# Patient Record
Sex: Female | Born: 1935 | Race: White | Hispanic: No | Marital: Married | Smoking: Never smoker
Health system: Southern US, Community
[De-identification: ages and names within clinical notes are randomized; demographics above are authoritative.]

## PROBLEM LIST (undated history)

## (undated) DIAGNOSIS — Z923 Personal history of irradiation: Secondary | ICD-10-CM

## (undated) DIAGNOSIS — K7689 Other specified diseases of liver: Secondary | ICD-10-CM

## (undated) DIAGNOSIS — M1990 Unspecified osteoarthritis, unspecified site: Secondary | ICD-10-CM

## (undated) DIAGNOSIS — N189 Chronic kidney disease, unspecified: Secondary | ICD-10-CM

## (undated) DIAGNOSIS — F419 Anxiety disorder, unspecified: Secondary | ICD-10-CM

## (undated) DIAGNOSIS — I10 Essential (primary) hypertension: Secondary | ICD-10-CM

## (undated) DIAGNOSIS — C541 Malignant neoplasm of endometrium: Secondary | ICD-10-CM

## (undated) DIAGNOSIS — Z9889 Other specified postprocedural states: Secondary | ICD-10-CM

## (undated) DIAGNOSIS — Z8742 Personal history of other diseases of the female genital tract: Secondary | ICD-10-CM

## (undated) DIAGNOSIS — E7800 Pure hypercholesterolemia, unspecified: Secondary | ICD-10-CM

## (undated) DIAGNOSIS — N939 Abnormal uterine and vaginal bleeding, unspecified: Secondary | ICD-10-CM

## (undated) HISTORY — DX: Essential (primary) hypertension: I10

## (undated) HISTORY — DX: Abnormal uterine and vaginal bleeding, unspecified: N93.9

## (undated) HISTORY — DX: Pure hypercholesterolemia, unspecified: E78.00

## (undated) HISTORY — DX: Personal history of irradiation: Z92.3

## (undated) HISTORY — PX: ABDOMINAL HYSTERECTOMY: SHX81

## (undated) HISTORY — DX: Chronic kidney disease, unspecified: N18.9

## (undated) HISTORY — DX: Other specified diseases of liver: K76.89

## (undated) HISTORY — DX: Anxiety disorder, unspecified: F41.9

## (undated) HISTORY — DX: Other specified postprocedural states: Z98.890

## (undated) HISTORY — DX: Personal history of other diseases of the female genital tract: Z87.42

## (undated) HISTORY — PX: INFECTED SKIN DEBRIDEMENT: SHX678

## (undated) HISTORY — DX: Malignant neoplasm of endometrium: C54.1

## (undated) HISTORY — DX: Unspecified osteoarthritis, unspecified site: M19.90

## (undated) HISTORY — PX: BREAST REDUCTION SURGERY: SHX8

## (undated) HISTORY — PX: OTHER SURGICAL HISTORY: SHX169

---

## 1997-08-17 ENCOUNTER — Emergency Department (HOSPITAL_COMMUNITY): Admission: EM | Admit: 1997-08-17 | Discharge: 1997-08-17 | Payer: Self-pay | Admitting: Emergency Medicine

## 1997-12-30 ENCOUNTER — Other Ambulatory Visit: Admission: RE | Admit: 1997-12-30 | Discharge: 1997-12-30 | Payer: Self-pay | Admitting: Internal Medicine

## 1999-01-25 ENCOUNTER — Other Ambulatory Visit: Admission: RE | Admit: 1999-01-25 | Discharge: 1999-01-25 | Payer: Self-pay | Admitting: Internal Medicine

## 1999-05-16 ENCOUNTER — Encounter: Admission: RE | Admit: 1999-05-16 | Discharge: 1999-05-16 | Payer: Self-pay | Admitting: Internal Medicine

## 1999-05-16 ENCOUNTER — Encounter: Payer: Self-pay | Admitting: Internal Medicine

## 1999-07-18 ENCOUNTER — Encounter: Admission: RE | Admit: 1999-07-18 | Discharge: 1999-07-18 | Payer: Self-pay | Admitting: Plastic Surgery

## 1999-07-18 ENCOUNTER — Encounter: Payer: Self-pay | Admitting: Plastic Surgery

## 1999-07-19 ENCOUNTER — Encounter (INDEPENDENT_AMBULATORY_CARE_PROVIDER_SITE_OTHER): Payer: Self-pay | Admitting: *Deleted

## 1999-07-19 ENCOUNTER — Ambulatory Visit (HOSPITAL_BASED_OUTPATIENT_CLINIC_OR_DEPARTMENT_OTHER): Admission: RE | Admit: 1999-07-19 | Discharge: 1999-07-20 | Payer: Self-pay | Admitting: Plastic Surgery

## 2000-06-11 ENCOUNTER — Encounter: Payer: Self-pay | Admitting: Orthopedic Surgery

## 2000-06-11 ENCOUNTER — Encounter: Admission: RE | Admit: 2000-06-11 | Discharge: 2000-06-11 | Payer: Self-pay | Admitting: Orthopedic Surgery

## 2000-06-12 ENCOUNTER — Ambulatory Visit (HOSPITAL_BASED_OUTPATIENT_CLINIC_OR_DEPARTMENT_OTHER): Admission: RE | Admit: 2000-06-12 | Discharge: 2000-06-12 | Payer: Self-pay | Admitting: *Deleted

## 2000-08-29 ENCOUNTER — Encounter: Admission: RE | Admit: 2000-08-29 | Discharge: 2000-08-29 | Payer: Self-pay | Admitting: General Surgery

## 2000-08-29 ENCOUNTER — Encounter: Payer: Self-pay | Admitting: General Surgery

## 2000-10-03 ENCOUNTER — Ambulatory Visit (HOSPITAL_COMMUNITY): Admission: RE | Admit: 2000-10-03 | Discharge: 2000-10-03 | Payer: Self-pay | Admitting: Gastroenterology

## 2001-03-06 ENCOUNTER — Encounter: Payer: Self-pay | Admitting: Internal Medicine

## 2001-03-06 ENCOUNTER — Encounter: Admission: RE | Admit: 2001-03-06 | Discharge: 2001-03-06 | Payer: Self-pay | Admitting: Internal Medicine

## 2001-06-14 ENCOUNTER — Encounter: Payer: Self-pay | Admitting: Internal Medicine

## 2001-06-14 ENCOUNTER — Encounter: Admission: RE | Admit: 2001-06-14 | Discharge: 2001-06-14 | Payer: Self-pay | Admitting: Internal Medicine

## 2002-02-06 ENCOUNTER — Encounter: Admission: RE | Admit: 2002-02-06 | Discharge: 2002-02-06 | Payer: Self-pay | Admitting: Internal Medicine

## 2002-02-06 ENCOUNTER — Encounter: Payer: Self-pay | Admitting: Internal Medicine

## 2002-04-16 ENCOUNTER — Encounter: Admission: RE | Admit: 2002-04-16 | Discharge: 2002-04-16 | Payer: Self-pay | Admitting: Internal Medicine

## 2002-04-16 ENCOUNTER — Encounter: Payer: Self-pay | Admitting: Internal Medicine

## 2003-02-10 ENCOUNTER — Encounter: Admission: RE | Admit: 2003-02-10 | Discharge: 2003-02-10 | Payer: Self-pay | Admitting: Internal Medicine

## 2004-03-28 ENCOUNTER — Encounter: Admission: RE | Admit: 2004-03-28 | Discharge: 2004-03-28 | Payer: Self-pay | Admitting: Internal Medicine

## 2004-04-06 ENCOUNTER — Encounter: Admission: RE | Admit: 2004-04-06 | Discharge: 2004-04-06 | Payer: Self-pay | Admitting: Internal Medicine

## 2005-01-26 ENCOUNTER — Ambulatory Visit (HOSPITAL_COMMUNITY): Admission: RE | Admit: 2005-01-26 | Discharge: 2005-01-26 | Payer: Self-pay | Admitting: General Surgery

## 2005-03-22 ENCOUNTER — Ambulatory Visit (HOSPITAL_COMMUNITY): Admission: RE | Admit: 2005-03-22 | Discharge: 2005-03-22 | Payer: Self-pay | Admitting: General Surgery

## 2005-03-25 ENCOUNTER — Emergency Department (HOSPITAL_COMMUNITY): Admission: EM | Admit: 2005-03-25 | Discharge: 2005-03-26 | Payer: Self-pay | Admitting: Emergency Medicine

## 2005-04-06 ENCOUNTER — Ambulatory Visit (HOSPITAL_COMMUNITY): Admission: RE | Admit: 2005-04-06 | Discharge: 2005-04-06 | Payer: Self-pay | Admitting: Internal Medicine

## 2005-05-12 ENCOUNTER — Encounter: Admission: RE | Admit: 2005-05-12 | Discharge: 2005-05-12 | Payer: Self-pay | Admitting: Internal Medicine

## 2005-07-12 ENCOUNTER — Encounter (INDEPENDENT_AMBULATORY_CARE_PROVIDER_SITE_OTHER): Payer: Self-pay | Admitting: *Deleted

## 2005-07-12 ENCOUNTER — Ambulatory Visit (HOSPITAL_COMMUNITY): Admission: RE | Admit: 2005-07-12 | Discharge: 2005-07-13 | Payer: Self-pay | Admitting: General Surgery

## 2005-09-25 ENCOUNTER — Other Ambulatory Visit: Admission: RE | Admit: 2005-09-25 | Discharge: 2005-09-25 | Payer: Self-pay | Admitting: Obstetrics and Gynecology

## 2005-10-25 ENCOUNTER — Encounter: Admission: RE | Admit: 2005-10-25 | Discharge: 2005-10-25 | Payer: Self-pay | Admitting: Internal Medicine

## 2005-11-13 ENCOUNTER — Encounter (INDEPENDENT_AMBULATORY_CARE_PROVIDER_SITE_OTHER): Payer: Self-pay | Admitting: Specialist

## 2005-11-13 ENCOUNTER — Ambulatory Visit (HOSPITAL_COMMUNITY): Admission: RE | Admit: 2005-11-13 | Discharge: 2005-11-13 | Payer: Self-pay | Admitting: Obstetrics and Gynecology

## 2006-05-14 ENCOUNTER — Encounter: Admission: RE | Admit: 2006-05-14 | Discharge: 2006-05-14 | Payer: Self-pay | Admitting: Internal Medicine

## 2007-05-18 ENCOUNTER — Ambulatory Visit: Payer: Self-pay | Admitting: Emergency Medicine

## 2007-05-18 ENCOUNTER — Inpatient Hospital Stay (HOSPITAL_COMMUNITY): Admission: EM | Admit: 2007-05-18 | Discharge: 2007-05-28 | Payer: Self-pay | Admitting: Emergency Medicine

## 2007-05-18 ENCOUNTER — Encounter (INDEPENDENT_AMBULATORY_CARE_PROVIDER_SITE_OTHER): Payer: Self-pay | Admitting: Orthopedic Surgery

## 2007-05-19 ENCOUNTER — Ambulatory Visit: Payer: Self-pay | Admitting: Infectious Diseases

## 2007-06-04 ENCOUNTER — Ambulatory Visit: Payer: Self-pay | Admitting: Internal Medicine

## 2007-06-04 DIAGNOSIS — M009 Pyogenic arthritis, unspecified: Secondary | ICD-10-CM | POA: Insufficient documentation

## 2007-06-24 ENCOUNTER — Telehealth (INDEPENDENT_AMBULATORY_CARE_PROVIDER_SITE_OTHER): Payer: Self-pay | Admitting: *Deleted

## 2007-06-25 ENCOUNTER — Emergency Department (HOSPITAL_COMMUNITY): Admission: EM | Admit: 2007-06-25 | Discharge: 2007-06-26 | Payer: Self-pay | Admitting: Emergency Medicine

## 2007-07-01 ENCOUNTER — Telehealth: Payer: Self-pay | Admitting: Internal Medicine

## 2007-07-02 ENCOUNTER — Ambulatory Visit: Payer: Self-pay | Admitting: Internal Medicine

## 2007-07-02 DIAGNOSIS — R197 Diarrhea, unspecified: Secondary | ICD-10-CM | POA: Insufficient documentation

## 2007-10-10 ENCOUNTER — Encounter: Admission: RE | Admit: 2007-10-10 | Discharge: 2007-10-10 | Payer: Self-pay | Admitting: Internal Medicine

## 2008-10-12 ENCOUNTER — Encounter: Admission: RE | Admit: 2008-10-12 | Discharge: 2008-10-12 | Payer: Self-pay | Admitting: Internal Medicine

## 2009-10-13 ENCOUNTER — Encounter: Admission: RE | Admit: 2009-10-13 | Discharge: 2009-10-13 | Payer: Self-pay | Admitting: Internal Medicine

## 2009-11-30 ENCOUNTER — Encounter: Admission: RE | Admit: 2009-11-30 | Discharge: 2009-11-30 | Payer: Self-pay | Admitting: Neurology

## 2010-05-07 ENCOUNTER — Encounter: Payer: Self-pay | Admitting: Internal Medicine

## 2010-05-08 ENCOUNTER — Encounter: Payer: Self-pay | Admitting: Internal Medicine

## 2010-06-08 ENCOUNTER — Ambulatory Visit: Payer: Medicare Other | Attending: Radiation Oncology | Admitting: Radiation Oncology

## 2010-06-08 DIAGNOSIS — M899 Disorder of bone, unspecified: Secondary | ICD-10-CM | POA: Insufficient documentation

## 2010-06-08 DIAGNOSIS — Z79899 Other long term (current) drug therapy: Secondary | ICD-10-CM | POA: Insufficient documentation

## 2010-06-08 DIAGNOSIS — Z510 Encounter for antineoplastic radiation therapy: Secondary | ICD-10-CM | POA: Insufficient documentation

## 2010-06-08 DIAGNOSIS — C55 Malignant neoplasm of uterus, part unspecified: Secondary | ICD-10-CM | POA: Insufficient documentation

## 2010-06-08 DIAGNOSIS — I10 Essential (primary) hypertension: Secondary | ICD-10-CM | POA: Insufficient documentation

## 2010-08-22 ENCOUNTER — Ambulatory Visit: Payer: Medicare Other | Attending: Radiation Oncology | Admitting: Radiation Oncology

## 2010-08-30 NOTE — H&P (Signed)
[name], [name] NO.:  [id number]   MEDICAL RECORD NO.:  [id number]          PATIENT TYPE:  INP   LOCATION:  0098                         FACILITY:  [facility]   PHYSICIAN:  Alvy Beal, MD    DATE OF BIRTH:  [birth year]   DATE OF ADMISSION:  05/18/2007  DATE OF DISCHARGE:                              HISTORY & PHYSICAL   HISTORY:  She is a 77 year old woman who presents with a 1 day history  of severe swelling of the right knee and pain.  The patient indicates  that in 2002 she had a ligament reconstruction and did okay and then  proceeded to have significant increasing knee pain.  She was then seen  by my partner, Dr. Simonne Come, who referred her to Dr. Lequita Halt.  Dr.  Lequita Halt felt as though a total knee was required because of severe  degenerative disease.  The patient was treated with physical therapy,  improved and was doing well until recently.  She notes no history of  pain or injury or trauma to the knee.  She states last night it just  began swelling and felt warm.  She had a recent bout of bronchitis for  which she was being treated.  She states now that the pain is the worst  it has ever been, it is a 10/10.  As a result of the swelling the knee  was aspirated by the ER physician and was felt to be a septic joint and  so orthopedic consultation was requested.   PAST MEDICAL, SURGICAL, FAMILY AND SOCIAL HISTORY:  Includes asthma,  hypertension, hypercholesterolemia.  She has had breast reduction  surgery, knee surgery and thyroid surgery.  She is a nonsmoker,  nondrinker, nondrug abuser.  She is allergic to DICLOFENAC.  She is  currently taking Crestor, prednisone, Benicar.  Other than the recent  bronchitis she is otherwise healthy.  She has no other pertinent  positives on her review of systems other than the degenerative joint  disease and the recent bronchitis.   CLINICAL EXAM:  She is in bed in obvious distress, complaining of severe  knee pain.   The right knee is warm and swollen.  There is an effusion  noted.  Her distal neurological exam is intact.  She has tibialis  anterior gastrocnemius.  EHL function is intact.  She has severe pain  with attempts at range of motion of the knee.  Vascular exam 2+  posterior tibial and dorsalis pedis pulses.  The left lower extremity  and both upper extremities are unremarkable on range of motion testing  palpation.   LAB:  Her urinalysis was negative.  CBC, her white count was 12.8 and  her hemoglobin is 11.6 and her platelet count is 253.  The Gram stain  from the knee aspirate demonstrated a cloudy yellowish fluid.  There is  84,700 white blood cells, no crystals and gram positive rods.   At this point in time I have discussed the case with Dr. Rennis Chris.  He  will plan on taking the patient later today for definitive I and D.  We  will consent the patient for an open versus arthroscopic I and D.   ADDENDUM:  X-rays were also reviewed from today.  They demonstrate  marked degenerative disease with significant medial compartment  arthritis.  There is a slight effusion but no evidence of any  osteomyelitis or lytic bony destruction.  At this point in time we will  keep the patient n.p.o. and plan on I and D.  Since we do already have  an aspirate we can get cultures and sensitivities and will plan on  starting her on IV antibiotics.      Alvy Beal, MD  Electronically Signed     DDB/MEDQ  D:  05/18/2007  T:  05/18/2007  Job:  [id number]

## 2010-08-30 NOTE — Op Note (Signed)
[name], WAHEED              ACCOUNT NO.:  [id number]   MEDICAL RECORD NO.:  [id number]          PATIENT TYPE:  INP   LOCATION:  1229                         [facility]AC[facility]   PHYSICIAN:  Vania Rea. Supple, M.D.  DATE OF BIRTH:  1928-04-03   DATE OF PROCEDURE:  05/20/2007  DATE OF DISCHARGE:                               OPERATIVE REPORT   PREOPERATIVE DIAGNOSES:  1. Pyogenic myositis.  2. Septic right knee.   POSTOPERATIVE DIAGNOSES:  1. Pyogenic myositis.  2. Septic right knee.   PROCEDURE:  1. Exploration, irrigation and debridement of right thigh and knee      joint.  2. Application of a vacuum assisted closure system to the right thigh      wound.   SURGEON OF RECORD:  Vania Rea. Supple, M.D.   Threasa HeadsFrench Ana A. Shuford, P.A.-C.   ANESTHESIA:  General endotracheal anesthetic.   BLOOD LOSS:  Minimal.   HISTORY:  [name] is a 77 year old female who was admitted over the  weekend with a septic right knee and evidence for myositis of the right  thigh anterior compartment musculature which was initially thought to  represent a clostridial infection, but on initial culture result it  appears as though she is growing out Group A beta hemolytic strep.  She  has been brought back for serial debridements of her right thigh wound  and is returned to the operating this evening for repeat I&D with  possible VAC applications.   Preoperatively discussed with Ms. Siebels, family members, treatment  options as well as risks versus benefits thereof.  Possible  complications of bleeding, persistent infection and likely need for  additional surgery reviewed.  They understand and accept and agree with  planned procedure.   PROCEDURE IN DETAIL:  After undergoing routine preop evaluation, the  patient was brought directly from the Intensive Care Unit intubated to  the operating room and transferred to the operating table in the supine  position.  She underwent application  of general anesthesia.  She was  placed into a semilateral position with the right hip elevated on the  bolster.  The right lower extremity was then sterilely prepped and  draped in standard fashion.  The wound was explored carefully and with  the vastus lateralis being elevated anteriorly and this allowed  inspection of the deep fascial layers and down to the femoral shaft.  We  carefully inspected all dissected surfaces and saw no obvious purulence.  All muscle had improved color and turgor compared to the initial  debridement.  Distally there was evidence for clear synovial fluid.  Did  not see any obvious purulence.  All muscle appeared viable.  No  significant muscular removal was necessary.  We used pulsatile lavage to  meticulously clean all surfaces and carefully inspected and deeply  palpated and compressed all the aspects of the thigh compartment  musculature.  There was no evidence for fluid accumulations or  abscesses.  Once we had meticulously irrigated and debrided all exposed  surfaces, we then allowed the vastus lateralis to fall into its anatomic  position.  A VAC system was then trimmed to fit over the lateral wound  which is approximately 40 cm in length.  We then fastened the VAC  sponges in place with the impervious drapes and then applied suction  with good sealing around the wound.  The right lower extremity was then  wrapped from foot to knee with an Ace bandage.  At this point then the  patient was transferred back to her hospital bed and returned to the  Intensive Care Unit intubated in stable condition.      Vania Rea. Supple, M.D.  Electronically Signed     KMS/MEDQ  D:  05/20/2007  T:  05/21/2007  Job:  [id number]

## 2010-08-30 NOTE — Op Note (Signed)
NAMEJAZLENE, [name]              ACCOUNT NO.:  [id number]   MEDICAL RECORD NO.:  [id number]          PATIENT TYPE:  INP   LOCATION:  1229                         FACILITY:  [facility]   PHYSICIAN:  Vania Rea. Supple, M.D.  DATE OF BIRTH:  10-25-1927   DATE OF PROCEDURE:  05/23/2007  DATE OF DISCHARGE:                               OPERATIVE REPORT   PREOPERATIVE DIAGNOSIS:  Right thigh pyomyositis status post  exploration, irrigation and debridement x3.   POSTOPERATIVE DIAGNOSIS:  Right thigh pyomyositis status post  exploration, irrigation and debridement x3.   PROCEDURE:  1. Irrigation and debridement of right thigh wound.  2. Delayed primary closure of right thigh wound approximately 40 cm in      length.   SURGEON:  Vania Rea. Supple, M.D.   Threasa HeadsFrench Ana A. Shuford, P.A.-C.   ANESTHESIA:  GENERAL ENDOTRACHEAL ANESTHESIA.   ESTIMATED BLOOD LOSS:  Minimal.   DRAINS:  Hemovacs x2.   HISTORY:  [name] is a 77 year old female who has been previously  treated for a right thigh anterior compartment myositis secondary to  group A beta strep with an associated septic right knee.  She has been  brought to the operating room for multiple debridements and at this  point, clinically is doing very well with a normalizing white count,  remains afebrile with improved pulmonary function.  She has had a vacuum  assisted closure device of the right thigh which has had some limited  serous drainage and at this time she is brought back to the operating  room for planned repeat I&D as well as probable delayed primary closure  of the right thigh wound.   We have counseled [name] as well as her family members on the  treatment options as well as risks and benefits thereof.  The possible  complications of bleeding, persistent infection, and possible need for  additional surgery are reviewed.  She understands and accepts and agrees  with the plan.   PROCEDURE IN DETAIL:  After  undergoing routine preop evaluation, the  patient is brought to the recovery room, underwent the smooth induction  of general endotracheal anesthesia on her hospital bed and then was  transferred supine to the operating table.  She was appropriately padded  and protected.  The right lower extremity was sterilely prepped and  draped in a standard fashion.  The wound edges were all viable.  The  underlying muscle was pink and had good tissue quality with bleeding  edges.  Deep dissection allowed inspection of the anterior margin of the  femoral shaft and the surrounding tissues all appeared viable.  There  was no obvious purulence.  There was no foul smell.  The pulsatile  lavage was then used to meticulously debride all dissected surfaces.  There was no obvious muscular necrosis.  At the completion of the  irrigation, the wounds were all then dried.  We performed a closure  distally of the deep fascia utilizing figure-of-eight #1 Vicryl sutures.  The remaining incision was closed with #1 nylon in vertical mattress  fashion which allowed excellent apposition of  the skin and subcutaneous  layers.  The deep fascia was left open.  We did place two Hemovac  drains, one was placed to medially in the deep aspect of the wound and  exited distally in the suprapatellar region and a second drain was  brought out proximally posteriorly and drained the posterior aspect of  the wound.  The wound was closed in the above mentioned fashion.  Adaptic and dry dressing was applied.  The leg was then dressed with a  bulky dry dressing wrapped with an Ace bandage.  The patient was then  extubated and taken to the recovery room stable condition.      Vania Rea. Supple, M.D.  Electronically Signed     KMS/MEDQ  D:  05/23/2007  T:  05/24/2007  Job:  [id number]

## 2010-08-30 NOTE — Op Note (Signed)
[name], Pamela NO.:  [id number]   MEDICAL RECORD NO.:  [id number]          PATIENT TYPE:  INP   LOCATION:  0098                         FACILITY:  [facility]   PHYSICIAN:  Vania Rea. Supple, M.D.  DATE OF BIRTH:  [birth year]   DATE OF PROCEDURE:  05/18/2007  DATE OF DISCHARGE:                               OPERATIVE REPORT   PREOPERATIVE DIAGNOSIS:  Acutely septic right knee.   POSTOPERATIVE DIAGNOSIS:  Acutely septic right knee.   PROCEDURE:  1. Right knee arthroscopic lavage.  2. Right knee arthroscopic extensive synovectomy.  3. Partial medial meniscectomy.  4. Chondroplasty medial compartment.   SURGEON:  Vania Rea. Supple, M.D.   ANESTHESIA:  LMA general.   TOURNIQUET TIME:  None was used.   ESTIMATED BLOOD LOSS:  Minimal.   DRAINS:  Hemovac x1.   SPECIMENS:  None.   HISTORY:  Pamela Pamela is a  77 year old female who presents with 24  hours of severe right knee pain with no antecedent trauma.  She was  recently diagnosed with a bronchitis.  She has been placed on a short  course of prednisone by primary physician.  She has had previous right  knee arthroscopy a number of years ago and had been having some  difficulties with the right knee pain several years ago and was told  that she needed additional surgery with Dr. Lequita Halt.  However, she began  exercise routine and had been doing relatively well with her right knee  until this most recent acute episode of pain.  On presentation to the  emergency room early this morning, she was found to be an extreme  discomfort with a painful swollen right knee.  A right knee aspiration  was performed by the emergency room staff revealing cloudy fluid with a  white cell count elevated at 84,000.  She is subsequently brought to the  operating at this time for planned right knee arthroscopy, debridement  and lavage.   Preoperatively counseled Pamela Pamela and her husband on treatment  options as well as risks  versus benefits thereof.  Possible surgical  complications bleeding, infection, neurovascular injury, DVT, PE, as  well as persistent pain, persistent infection and possible need for  additional surgery reviewed.  She understands and accepts and agrees for  planned procedure.   PROCEDURE IN DETAIL:  After undergoing routine preop evaluation, the  patient brought to the operating room, placed upon the operating table  and underwent smooth induction of LMA general anesthesia.  Right thigh  was placed in leg holder.  Right lower extremity was sterilely prepped  and draped in standard fashion.  Standard arthroscopy portals were  established and diagnostic arthroscopies performed.  The  suprapatellar  pouch showed diffuse synovitis and there were a number of bands of  fibrinous exudate and significant proliferative synovial overgrowth.  A  shaver was introduced and used to perform an extensive synovectomy in  the suprapatellar pouch as well as medial and lateral gutters.  The  patellofemoral joint showed some mild fibrillation of the tibia surfaces  but no chondral fractures or full thickness defects.  There was normal  patellar tracking.  There was significant proliferative overgrowth of  the anterior chamber synovial tissues and extensive synovectomy was  performed in this area.  The trial notch showed the ACL to be intact.  Medially there was evidence for some early arthrosis with an area of  full-thickness cartilage loss over the most medial aspect of the medial  tibial plateau and surrounding area grade 3 chondromalacia.  In  addition, there was degenerative tear involving the middle third of the  medial meniscus.  A shaver was introduced and used to debride the  meniscus back to stable peripheral margin and perform a chondroplasty,  both medial tibial plateau the medial femoral condyle.  Synovitis in the  inner cartilage notch as well as medial gutter was also debrided.  Laterally there  was synovial overgrowth anteriorly which was debrided.  Lateral compartment itself is in relatively good condition. Meniscus was  intact.  Additional time was spent completing the synovectomy and  debriding all aspects of the joint removing all synovial tissues that  appeared inflamed and performing and  completing an extensive  synovectomy.  The joint was then copiously lavaged.  At this point,  fluid and instruments were then removed.  A Hemovac drain was brought in  superomedially  through the inflow cannula portal.  The portals then  closed with a 3-0 nylon.  The drain was not sutured in.  A bulky dry  dressing was then wrapped about the right knee and leg wrapped in Ace  bandage.  The patient was then transferred to the recovery room in  stable condition.      Vania Rea. Supple, M.D.  Electronically Signed     KMS/MEDQ  D:  05/18/2007  T:  05/19/2007  Job:  [id number]

## 2010-08-30 NOTE — Op Note (Signed)
NAMESHARNETTA, GIELOW              ACCOUNT NO.:  [id number]   MEDICAL RECORD NO.:  [id number]          PATIENT TYPE:  INP   LOCATION:  1229                         [facility]   PHYSICIAN:  Vania Rea. Supple, M.D.  DATE OF BIRTH:  Oct 21, 1927   DATE OF PROCEDURE:  05/19/2007  DATE OF DISCHARGE:                               OPERATIVE REPORT   PREOPERATIVE DIAGNOSIS:  Right thigh anterior compartment clostridial  myonecrosis.   POSTOPERATIVE DIAGNOSIS:  Right thigh anterior compartment clostridial  myonecrosis.   PROCEDURE:  Exploration, irrigation and debridement of right thigh and  right knee joint.   SURGEON:  Vania Rea. Supple, M.D.   Threasa HeadsFrench Ana A. Shuford, P.A.-C.   ANESTHESIA:  General endotracheal.   ESTIMATED BLOOD LOSS:  Minimal.   HISTORY:  Ms. Deberry is a 77 year old female presented to the  emergency department yesterday with a right knee and leg pain.  Ultimately found to have apparent posterior myonecrosis with a septic  knee joint.  She has had previous arthroscopic lavage and subsequent  fasciotomy and debridement of the anterior thigh yesterday.  Is brought  back to the operating today for repeat I&D and dressing change.   We discussed with Ms. Andres and in particular her family members  including the two daughters regarding treatment plan.  Possible  complications including bleeding, infection, neurovascular injury and  likely additional debridements were reviewed.  They understand and  accept and agree with planned procedure.   PROCEDURE:  The patient was brought directly from the Intensive Care  Unit intubated directly to the operating suite.  She is transferred to  the  operating table in supine position and underwent appropriate  sedation and relaxation.  The dressings were removed from the right  lower extremity and the right lower femur was then sterilely prepped and  draped in standard fashion.  The bulk of the vastus lateralis  remained  pink and remained contractile.  There were a few areas of necrotic  muscle tissue along the surgical margin which I feel was more related to  the dissection then to any underlying septic process.  These areas were  sharply debrided with Mayo scissors.  The vastus lateralis was then  elevated anteriorly.  This allowed visualization of the entire femoral  shaft and the majority of the musculature and the anterior medial  aspects in the middle and medial aspects of the anterior compartment  appeared viable.  Digital dissection was carried through all tissue  planes and anterior compartment all the way up to the femoral neck and  lesser trochanter.  Compressing the knee did reveal some areas of  purulence which appeared to be coming from the knee joint.  We excised  the suprapatellar pouch and made the knee joint confluent with the  dissection proximally and then digitally explored the joint and removed  any adhesions and completely opened the joint.  Some limited debridement  of soft tissues about the suprapatellar pouch was performed.  We  carefully probed and compressed the tissues and musculature of the  anterior compartment, found no obvious additional purulent accumulation.  Overall the muscles appeared viable and they certainly responded to  electrocautery with  contraction.  After exploration was completed to  our satisfaction, we then used pulsatile lavage to copiously irrigate  all dissected surfaces.  Hemostasis was obtained.  The wound was then  packed with moist Kerlix x2 followed by multiple ABDs, Kerlix wrap about  the knee and an Ace bandages.  The patient was then transferred back to  the intensive care unit intubated but stable.   Plan at this point for continued antibiotics and then a hemodynamic  support under the management of the intensive care specialist.  Plan for  repeat I&D of the thigh tomorrow.      Vania Rea. Supple, M.D.  Electronically  Signed     KMS/MEDQ  D:  05/19/2007  T:  05/20/2007  Job:  [id number]

## 2010-08-30 NOTE — Discharge Summary (Signed)
[name], [name]              ACCOUNT NO.:  [id number]   MEDICAL RECORD NO.:  [id number]          PATIENT TYPE:  INP   LOCATION:  1610                         FACILITY:  [facility]   PHYSICIAN:  Vania Rea. Supple, M.D.  DATE OF BIRTH:  [birth year]   DATE OF ADMISSION:  05/18/2007  DATE OF DISCHARGE:  05/28/2007                               DISCHARGE SUMMARY   ADMISSION DIAGNOSES:  1. Septic right knee.  2. Hypercholesteremia  3. Recent bouts of bronchitis.   DISCHARGE DIAGNOSES:  1. 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  2. Septicemia.  3. Acute respiratory failure, secondary to septic shock.   OPERATIONS:  1. May 18, 2007, arthroscopic lavage, synovectomy and partial      medial meniscectomy and chondroplasty, date January 31, surgeon Dr.      Francena Hanly.  2. 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  3. Surgery May 20, 2007 is a repeat I&D, right thigh, with back      application, surgeon Dr. Rennis Chris, assistant Ray Sugar, PA-C.  4. May 23, 2007 for repeat I&D and delayed primary closure of her      right thigh, surgeon Dr. Rennis Chris, assistant Ray Sugar, PA-C.   CONSULTANTS:  Dr. Lina Sayre and Dr. Cliffton Asters, infectious disease  and critical care medicine consult.   BRIEF HISTORY:  [name] is a 77 year old female who developed  acute onset of knee pain in a 24-hour period, in her usual state of  health in her home.  She was seen in the emergency room, where initial  aspirate of the knee revealed 87,000 white count, with her findings of  severe knee pain was admitted for arthroscopic lavage to her right knee.  The patient underwent this initial surgery and postoperatively on post-  op check continued to have severe writhing pain.  It was uncontrollable  with complaints of right hip, buttocks and thigh pain.  She was  emergently taken down to  radiology for a lumbar MRI.  Plain films of her  back and hip which all revealed no particular acute findings.  She was  then taken to CT, where a CT of abdomen and pelvis that included into  her thigh showed evidence of a significant myositis within the right  thigh.  We called infectious disease, and Dr. Darlina Sicilian came and saw and  evaluated the patient.  It was felt with initial Gram's stains, looked  to be gram-positive rods.  This was felt that she may have a rare case  of clostridial bacteremia and myonecrosis of right thigh.  She was  emergently taken back to the operating room, where the anterior  compartment and musculatures were opened.  There was certainly edematous  muscle tissue, but no gross or frankly necrotic tissue was noted.  The  wound was packed and left open.  Intraoperatively, the patient became  critically ill; however, requiring multiple pressors.  The critical care  medicine team was consulted, and she  remained intubated overnight.  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  The following day,  she returned to the operating room where a repeat I&D showed the muscle  to be again pink and viable with no significant purulence.  She did  receive transfusions.  Please see chart for all details.  She was  ultimately taken back to the operating room again on May 20, 2007  for repeat I&D, at which time a vac was placed.  She was able to be  weaned off of the vent and off pressors and stabilized.  As the cultures  began to grow out further more definitive findings, it was found that  she had a group A strep, instead of the clostridial in the right thigh.  Upon further questioning with family, she had had a significant  bronchitis and been seen by her outpatient medical doctor and was on a  short course of steroids and inhaler, but was not on any current  antibiotics.  They reported that she had had  a severe sore throat,  throughout this.  The patient continued to stabilize.  The wounds after  the vac change, showed good quality tissue.  Her white count began to  trend downward.  On May 23, 2007, she was afebrile, she was alert  and oriented.  She just have regular bloody drainage noted within the  vac.  She had made excellent progress and therefore was felt to be  stable, returned to the OR for secondary wound closure.  She went  underwent this procedure and tolerated this well.  Dr. Orvan Falconer changed  the antibiotics to IV Ancef, and she continued to stabilize and was  ultimately transferred from the critical care ICU to the regular  orthopedic floor.  She began physical therapy and immobilization.  Her  incisions remained clean and dry.  She remained afebrile, was ambulating  short distances with therapy by date May 27, 2007.  At this time  this chart discharge planning was began.  She had had a PICC line  placed, and it was felt that she should continue a full 21 days of IV  Ancef, to home health therapy for both antibiotics, and home health  physical therapy were ordered.  On date May 28, 2007, the patient  had made incredible gains and was improving daily.  At this time, she  was felt to be medically stable to be discharged to an outpatient and  the care of her family.  Would course of close follow-up.  All  appropriate arrangements were made, and she was discharged home at this  time.   PERTINENT LABORATORY DATA:  Shows a pathology on January 31, benign  skeletal was with acute inflammation of soft tissue of the right thigh.  Multiple chest x-rays found in the chart, as well as PICC line  placement.  Please see chart for all radiology details.  In summary,  however, right hip films were negative.  Lumbar spine films showed  degenerative changes.  A MRI of her lumbar spine showed no evidence of  multilevel degenerative changes.  Chest films showed central line   placements, left base atelectasis.  CT scans of the abdomen and pelvis  showed infectious myositis in the anterior compartment of the right  thigh, without abscess.  Again, daily chest x-rays noted in the chart.  A swallowing study showed no swallowing abnormalities, multiple units  found from transfused.  EKG showed normal sinus rhythm  with right bundle  branch block.  The final lab section was not in the chart at time of  dictation.   CONDITION ON DISCHARGE:  Stable, improved.   DISCHARGE MEDS AND PLAN:  The patient is being discharged to home on  home health physical therapy.  She will continue on Ancef.  Follow up  with Dr. Orvan Falconer in 2 weeks, Dr. Rennis Chris in one week, and primary care  doctor and 2 weeks.  Primary care was Dr. [name].  She may be  weightbearing as tolerated.  She will need daily dressing changes right  thigh.  Prescriptions for Percocet, Ativan, Robaxin were provided.      Tracy A. Shuford, P.A.-C.      Vania Rea. Supple, M.D.  Electronically Signed    TAS/MEDQ  D:  06/14/2007  T:  06/15/2007  Job:  [id number]   cc:   [name], M.D.  Fax: [phone]   Fransisco Hertz, M.D.  Fax: [phone]   Cliffton Asters, M.D.  Fax: [phone]

## 2010-08-30 NOTE — Op Note (Signed)
NAMEMAURICE, RAMSEUR              ACCOUNT NO.:  [id number]   MEDICAL RECORD NO.:  [id number]          PATIENT TYPE:  INP   LOCATION:  1605                         FACILITY:  [facility]   PHYSICIAN:  Vania Rea. Supple, M.D.  DATE OF BIRTH:  17-Nov-1927   DATE OF PROCEDURE:  05/18/2007  DATE OF DISCHARGE:                               OPERATIVE REPORT   PREOPERATIVE DIAGNOSIS:  Clostridial myonecrosis, right thigh anterior  compartment musculature.   POSTOPERATIVE DIAGNOSIS:  Clostridial myonecrosis, right thigh anterior  compartment musculature.   PROCEDURE:  Extensive incision, irrigation, and debridement of right  thigh anterior compartment clostridial myonecrosis.   SURGEON OF RECORD:  Vania Rea. Supple, M.D.   ASSISTANTFrench Ana Shuford, P.A.-C.   ANESTHESIA:  General endotracheal.   BLOOD LOSS:  Less and 150 mL.   SPECIMENS:  Muscle was sent for routine pathology.  We also obtained  aerobic and anaerobic cultures.   HISTORY:  Ms. Eshleman is a 77 year old female who presented to [facility] room early the morning of May 18, 2007 with 12 to 18-  hour complaints of severe right knee and thigh pain.  Initial evaluation  was significant primarily for pain and swelling about the knee, and an  aspiration was performed revealing cloudy fluid which ultimately showed  a cell count greater than 80,000 and Gram stain showing evidence for  gram-positive rods.  I subsequently took Ms. Polimeni to the operating  room earlier today where I performed arthroscopic lavage, synovectomy,  and debridement.  Postoperatively, Ms. Muraski was found to have  continued excruciating pain in the right thigh as well as hip region.  Her examination did not show any focal areas of erythema or fluctuance.  She is nontender with palpation of her thoracolumbar spine.  Subsequent  MRI scan of the lumbar spine and hip did not show any obvious acute  fractures or obvious lytic or infectious  processes.  Subsequent CT scan  did not show any obvious acute intra-abdominal pathologies, although  there was a large left renal cyst.  CT scan of the hip and thigh,  however, did show evidence for severe edema involving the anterior  compartment musculature, very suspicious for myonecrosis.  Subsequent  consultation with Dr. Lina Sayre from infectious disease and review of  the case history was again highly suggestive of a clostridial  myonecrosis.  In consideration of these findings, Ms. Hutmacher is now  brought back emergently to the operating room for planned exploration,  incision and drainage, and debridement of the anterior thigh compartment  musculature.   I have reviewed with Ms. Sandefer and her husband these unfortunate  findings.  We discussed at length the long-term prognosis and treatment  options.  Possible complications of bleeding, persistent infection, and  the high likelihood for perioperative morbidity and/or mortality are  reviewed.  They understand and accept and agree with our plan for  surgical debridement.   PROCEDURE IN DETAIL:  After undergoing comprehensive medical evaluation,  the patient was brought to the operating room on her hospital bed and  underwent smooth induction of a general endotracheal  anesthesia.  She  was transferred to the operating table and positioned in a semi-lateral  position with the right hip and leg elevated.  The right lower extremity  was then sterilely prepped and draped in standard fashion.   A longitudinal 40-cm incision was then made in the lateral aspect of the  thigh with sharp dissection.  Electrocautery was used for hemostasis.  The deep fascia was then divided in line with the skin incision,  revealing a bulging vastus lateralis which did not appear grossly  necrotic but certainly had an edematous character.  The vastus lateralis  was then retracted anteriorly and was dissected away from the lateral  intermuscular  septum, and the perforating vessels were carefully  identified and ligated with electrocautery.  We then further reflected  the vastus lateralis, gaining access to the femoral shaft.  Upon  dissection anterior into the medial aspect of the intercompartment  musculature, a significant amount of serous fluid was encountered.  We  did obtain cultures.  The vastus intermedius and the deep layer of the  vastus medialis showed a very edematous, almost gelatinous appearance.  However, all muscle tissues responded with contractile movements with  electrocautery.  There was no grossly necrotic tissues.  There was no  foul smell, no obvious purulence.  We bluntly dissected between all  tissue planes and freed up any loculations of the serous fluid.  A  specimen of muscle from the vastus intermedius was resected and sent to  pathology for routine evaluation.  Once all tissue planes were explored  and opened, we then used the pulsatile lavage to meticulously irrigate  the area.  We then took two saline-moistened Kerlix sponges and packed  the wound widely open and wrapped a series of ABDs over the wound and  then wrapped the leg with Kerlix and Ace bandages.   Of note during the operation, the patient was notably hypotensive.  Volume and pressor agents were initiated.  At the conclusion of the  case, I spoke with the intensive care specialist, and we elected to  maintain the patient intubated and also established a central line as  well as an arterial line.  The patient would be then directly  transferred to the intensive care unit for monitoring.      Vania Rea. Supple, M.D.  Electronically Signed     KMS/MEDQ  D:  05/18/2007  T:  05/19/2007  Job:  [id number]

## 2010-09-02 NOTE — Op Note (Signed)
[facility]  Patient:    [name], [name]                     MRN: [id number] Proc. Date: 07/19/99 Adm. Date:  [id number] Attending:  Loura Halt Ii                           Operative Report  PREOPERATIVE DIAGNOSIS:  Bilateral macromastia.  PREOPERATIVE DIAGNOSIS:  Bilateral macromastia.  PROCEDURE:  Bilateral reduction mammoplasty; 424 gm removed from right breast and 446 gm removed from left breast.  SURGEON:  Alfredia Ferguson, M.D.  FIRST ASSISTANT:  Consuello Bossier., M.D.  ANESTHESIA:  General endotracheal.  INDICATIONS FOR PROCEDURE:  This is a 77 year old woman who complains of symptomatic macromastia with symptoms of heaviness, bra strap grooving, upper back pain, shoulder pain.  She wishes to have her breasts reduced to approximately a B or C cup.  She understands the risks of surgery including overreduction, underreduction, bleeding, hematoma, infection, unsightly scarring, numbness of the nipple, vascular compromise to the nipple, asymmetry, overall dissatisfaction with the results.  She understands that there is a risk for a need for secondary surgery.  In spite of that, she wishes to proceed with the operation.  DESCRIPTION OF PROCEDURE:  On the day prior to surgery, skin marks were placed outlining an inferior pedicle technique using the Wise pattern.  The patient was taken to the operating room this morning where she was given general endotracheal anesthesia.  Attention was first directed to the right breast. An 8-cm wide inferior pedicle was marked out on the breast with a 42-mm circle for areolar diameter also marked.  The 42-mm circle was now incised to preserve the nipple areolar complex on the pedicle.  All skin marks were incised including the inferiorly based pedicle.  The inferior based pedicle was now ______ .  This pedicle containing the nipple areolar complex was dissected away from the surrounding  breast tissue, dissecting straight down to the chest wall medially and obliquely, cutting in a cephalad direction superiorly.  The lateral portion of the inferior pedicle was cut on a bias going in a lateral direction to ensure vascular integrity to the pedicle.  The medial superior and lateral breast mass were now undermined off of the pectoralis fascia.  The medial dermoglandular wedge was now dissected away from the medial breast flap, cutting it slightly on the bias in a medial direction.  Once this had been cut down to the chest wall, the medial dermoglandular wedge was excised, passing it off for weight.  The lateral dermoglandular wedge was now dissected using electrocautery, thinning the medial breast flap to approximately 3 cm in width.  This dermoglandular wedge was dissected away from the medial breast flap, cutting it obliquely on the undersurface of the breast flap.  This excess breast tissue was dissected from a lateral to a medial direction, connecting this dissection with the central breast flap and removing the excess breast tissue beneath the central breast flap.  All tissue was passed off the field and weighed, a total weight of 424 grams on the right side.  Hemostasis was accomplished throughout the procedure using electrocautery.  The wound was now copiously irrigated with saline irrigation.  Once hemostasis had been assured, the inferior corner of the vertical limbs of my medial and lateral breast flaps were united and closed to the mid portion of the inframammary crease using  2-0 Vicryl suture.  The superior corner of the vertical limb of my breast flaps was also closed in a similar fashion.  Nipple areolar complex was placed in its new location and fixed in position using interrupted 3-0 Vicryl for the dermis.  A moist lap was placed over the right breast, and attention was directed to the left side where an identical reduction was carried out.  A total of 446 gm was  removed from the left side.  Again, hemostasis was accomplished using electrocautery. The wound was copiously irrigated with saline irrigation.  Closure was carried out on the left and right side simultaneously.  The vertical and inframammary crease incisions were closed using multiple interrupted 3-0 Vicryl suture for the dermis.  The nipple areolar complex was closed in its new location in a similar fashion.  Symmetry appeared to be good at the conclusion of the procedure.  Nipple areolar complexes were viable from a vascular standpoint bilaterally.  A bulky dressing was placed over the breasts followed by a circumferential six-inch ACE bandage.  The patient tolerated the procedure well with an estimated blood loss of less than 100 cc.  She was awakened, extubated, and transported to the recovery room in satisfactory condition. DD:  07/19/99 TD:  07/19/99 Job: [id number]

## 2010-09-02 NOTE — Op Note (Signed)
[name], [name] NO.:  [id number]   MEDICAL RECORD NO.:  [id number]          PATIENT TYPE:  AMB   LOCATION:  SDC                           FACILITY:  [facility]   PHYSICIAN:  Artist Pais, M.D.    DATE OF BIRTH:  1927/12/28   DATE OF PROCEDURE:  11/13/2005  DATE OF DISCHARGE:                                 OPERATIVE REPORT   PREOPERATIVE DIAGNOSIS:  Endometrial polyp.   POSTOPERATIVE DIAGNOSIS:  Endometrial polyp.  Many prolapsed hemorrhoids  noted.   OPERATION PERFORMED:  Dilation and curettage, hysteroscopy and polypectomy.   SURGEON:  Artist Pais, M.D., Ph.D.   ASSISTANT:  None.   ANESTHESIA:  General by LMA.   SPECIMENS:  Endometrial polyp and curettings to pathology.   ESTIMATED BLOOD LOSS:  Minimal.   COMPLICATIONS:  None.   FLUIDS REPLACED:  Approximately 800 mL of crystalloid.   DESCRIPTION OF PROCEDURE:  The patient was brought to the operating room,  identified on the operating table.  After induction of adequate general  anesthesia, the patient was placed in the dorsal lithotomy position and  prepped and draped in the usual sterile fashion.  The bladder was straight  cathed for approximately 100 mL of clear, yellow urine.  An examination  under anesthesia revealed the uterus to be retroverted approximately six  weeks and mobile.  She was also found to have multiple prolapsed hemorrhoids  which were not noted at time of her sonohysterogram several weeks ago.  A  speculum was placed and the posterior lip of the cervix was infiltrated with  2 mL of 1% lidocaine and grasped with a single toothed tenaculum.  The  remaining 18 mL of 1% lidocaine were placed for a paracervical block without  difficulty.  Subsequently, I attempted to dilate her cervix but was having  difficulty and thus the cervical os finder was secured and this worked  indeed quite well.  I just gently placed the cervical os finder and it  gently dilated up her cervix.   Subsequently, I was able to easily dilate her  cervix up to a #25 Pratt dilator.  The uterus sounded to approximately 7.5  cm.  Using the [facility] hysteroscope with Sorbitol as a distending medium, a  careful and thorough hysteroscopic examination was performed.  I was able  to easily see the right tubal ostium.  The polyp was noted to be elongated,  stretching from the left tubal ostium all across the fundal portion of the  uterus.  Subsequently, the scope was removed and the serrated curette was  used to curette the uterus in a systematic clockwise fashion.  Subsequently,  the Randall stone forceps were placed and I was able to remove the polyp in  its entirety without difficulty.  After a good cry was heard all around, I  then placed the hysteroscope and confirmed that the polyp had been  completely removed and was able to identify the left tubal ostium.  The  endometrium was noted to be very thin.  At that point the procedure was then  terminated.  All instruments were removed  from the operative field and the  tenaculum was removed.  There was noted to be no bleeding from the tenaculum  site and only minimal bleeding from the uterus.  The specimens were sent to  pathology for evaluation.  The patient was subsequently transferred to the  recovery room in stable condition after all instrument, sponge, and needle  counts were correct.  She received a postoperative instruction sheet and  urged to not make any financial decisions for 24 hours.  She can take plain  Tylenol for pain.  She is to return to the office in two to three weeks for  follow-up evaluation.           ______________________________  Artist Pais, M.D.     DC/MEDQ  D:  11/13/2005  T:  11/14/2005  Job:  [id number]   cc:   [name], M.D.  Fax: [phone]

## 2010-09-02 NOTE — Op Note (Signed)
[place]  Patient:    [name], [name]                     MRN: [id number] Proc. Date: 06/12/00 Adm. Date:  [id number] Attending:  Twana First                           Operative Report  PREOPERATIVE DIAGNOSES: 1. Right knee medial meniscus tear. 2. 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 3. Right knee patellofemoral chondromalacia.  POSTOPERATIVE DIAGNOSES: 1. Right knee medial meniscus tear. 2. 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 3. Right knee patellofemoral chondromalacia.  PROCEDURE: 1. Right knee EUA followed by arthroscopic partial medial meniscectomy. 2. Right knee medial femoral chondral drilling. 3. Right knee medial femoral condyle and patellofemoral chondroplasty.  SURGEON:  Elana Alm. Thurston Hole, M.D.  ANESTHESIA:  General.  OPERATIVE TIME:  30 minutes.  COMPLICATIONS:  None.  INDICATIONS FOR PROCEDURE:  Ms. Thackston is a 77 year old woman who has had two to three years of increasing right knee pain with signs and symptoms and MRI documenting medial femoral condyle avascular necrosis and a medial meniscus tear who has failed conservative care and is now to undergo arthroscopy.  DESCRIPTION:  Ms. Toothman is brought to the operating room on June 12, 2000, placed on the operative table in supine position.  After an adequate level of general anesthesia was obtained her right knee was examined under anesthesia.  She had full range of motion and her knee was stable ligamentous exam with normal patella tracking.  The right leg was prepped using sterile Betadine and draped using sterile technique.  Initially, through an inferolateral portal, the arthroscope with a pump attached was placed and through an inferior medial portal an arthroscopic probe was placed.  On initial inspection of the medial compartment the articular cartilage on the medial  femoral condyle showed a large 50% grade 3 condylar defect and chondromalacia which was thoroughly debrided.  The articular cartilage at the base, however, was intact and there was not a loose piece of osteocartilaginous bone secondary to her AVN but the demarcation area was very evident between normal and chondromalacia tissue.  This was debrided.  It was felt that this was amenable to drilling, and using a 0.62 K-wire multiple drill holes were placed in this area.  The medial tibial plateau showed mild grade 1 and 2 chondromalacia.  The medial meniscus had a split tear, complex in nature, posterior and medial horn, of which 50% was resected back to a stable rim.  ACL/PCL was normal, lateral compartment articular cartilage normal, femoral condyle lateral tibial plateau articular cartilage normal, lateral meniscus normal.  Patellofemoral joint showed grade 3 chondromalacia over 50-60% of the patella but only mild grade 1 and 2 changes on the femoral groove.  This was debrided.  The patella tracked normally.  Moderate synovitis in the mediolateral gutters was debrided.  Otherwise, they were free of pathology.  After this was done, it was felt that all pathology had been satisfactorily addressed.  The instruments were removed.  Portals closed with 3-0 nylon suture and injected with 0.25% Marcaine with epinephrine and morphine 4 mg.  Sterile dressings applied.  The patient awakened and taken to the recovery room in stable condition..  FOLLOW-UP CARE:  Ms. Kral will be followed as an outpatient on Vicodin and Naprosyn.  She will be on crutches.  See her  back in the office in a week for sutures out and follow-up.DD:  06/12/00 TD:  06/12/00 Job: [id number]

## 2010-09-02 NOTE — Op Note (Signed)
[name], [name]              ACCOUNT NO.:  [id number]   MEDICAL RECORD NO.:  [id number]          PATIENT TYPE:  OIB   LOCATION:  5735                         FACILITY:  [facility]   PHYSICIAN:  Angelia Mould. Derrell Lolling, M.D.DATE OF BIRTH:  [birth year]   DATE OF PROCEDURE:  07/12/2005  DATE OF DISCHARGE:  07/13/2005                                 OPERATIVE REPORT   PREOPERATIVE DIAGNOSIS:  Primary hyperparathyroidism.   POSTOPERATIVE DIAGNOSIS:  Primary hyperparathyroidism.   OPERATION PERFORMED:  Minimally invasive radionuclide localized  parathyroidectomy.   SURGEON:  Angelia Mould. Derrell Lolling, M.D.   FIRST ASSISTANT:  Currie Paris, M.D.   OPERATIVE INDICATIONS:  This is a 77 year old white female who enjoys  relatively good health.  She was found to have hypercalcemia and on two  occasions her intact parathyroid hormone level was significantly elevated,  consistent with primary hyperparathyroidism.  A nuclear medicine parathyroid  scan was performed and appears to localize a parathyroid adenoma in the  region of the lower pole of the right limb of the thyroid gland.  Elective  parathyroidectomy was advised and she is brought to operating room  electively.   OPERATIVE FINDINGS:  The patient had a parathyroid adenoma in the right  inferior position.  The blood supply to this appeared to be from the  inferior parathyroid artery and it was relatively anterior suggesting that  this was a true right inferior parathyroid adenoma.  This was a large benign  appearing gland.  There was no adenopathy.  Dr. Delila Spence performed the frozen  section and said this was consistent with a parathyroid adenoma.   OPERATIVE TECHNIQUE:  The patient had a sestamibi injection by the nuclear  medicine department approximately 75 minutes prior to the procedure.  The  patient was taken to the operating room and placed on the operating table.  General anesthesia was induced.  She was then positioned in a  reversed  Trendelenburg position with her neck extended.  The neck was prepped and  draped in sterile fashion.  The NeoProbe was used and it did appear that  there was increased radioactivity in the right neck just above the clavicle.  A short 2-cm transverse incision was made on the right side.  Dissection was  carried down through the platysma muscle.  The platysma muscle was mobilized  a little bit superiorly and inferiorly.  The strap muscles were divided in  the midline and the right strap muscles were dissected off of the thyroid  gland.  With blunt and sharp dissection, we dissected down and visualized  and encountered what appeared to be a parathyroid adenoma.  There were  significantly increased radioactivity counts on this mass and we easily  dissected this away from the lower pole of the thyroid gland and the  paratracheal tissues.  It had a small vascular channel coming from medial to  lateral, which was then skeletonized and isolated and secured metal clips.  The areolar tissues were dissected away and the encapsulated tissue mass was  sent intact to the lab.  Frozen section exam by Dr. Delila Spence confirmed that  this  was consistent with a parathyroid adenoma.  The wound was irrigated  with saline.  Hemostasis was excellent.  We placed a small piece of Surgicel  gauze in the peritracheal space where the  dissection had been done.  We  observed this for almost 10 minutes and there was no bleeding.  We closed  the strap muscles in the midline with interrupted sutures of 3-0 Vicryl.  The platysma muscle was closed with interrupted inverted sutures of 3-0  Vicryl and the skin closed with a running subcuticular suture of 4-0  Monocryl and Steri-Strips.  Clean bandages were placed and the patient taken  to the recovery room in stable condition.   ESTIMATED BLOOD LOSS:  About 10 mL or less.   COMPLICATIONS:  None   Sponge, needle and instrument counts were correct.      Angelia Mould. Derrell Lolling, M.D.  Electronically Signed     HMI/MEDQ  D:  07/12/2005  T:  07/13/2005  Job:  [id number]   cc:   [name], M.D.  Fax: [phone]   Lyn Records, M.D.  Fax: [phone]

## 2010-09-02 NOTE — H&P (Signed)
NAME:  [name], KRIST NO.:  [id number]   MEDICAL RECORD NO.:  [id number]           PATIENT TYPE:   LOCATION:                                FACILITY:  [facility]   PHYSICIAN:  Artist Pais, M.D.    DATE OF BIRTH:  Dec 25, 1927   DATE OF ADMISSION:  DATE OF DISCHARGE:                                HISTORY & PHYSICAL   CURRENT HISTORY:  The patient is a 77 year old, gravida 3, para 3, Caucasian  female who complained last year in October of postmenopausal bleeding.  She  subsequently had a pelvic ultrasound which showed a thick endometrium of 5.9  mm.  She also was found to have small fibroids.  In her endometrium, she was  found to have 2 hyperechoic masses, and decision was made for the patient to  undergo sonohistogram.  She subsequently underwent a sonohistogram and was  found to have a hyperechoic fundal mass measuring 8 x 6 mm consistent with  an endometrial polyp.  The patient at that time was advised to undergo a D&C  hysteroscopy, and this was indeed scheduled.  However, the patient began  having cardiac problems and called to cancel her surgery.  Subsequently in  June, the patient called, which was 5 months later, saying that she was  ready to have her surgery.  She was asked to return for a followup  sonohistogram to ensure that this mass was still present.  The sonohistogram  was performed, and this was found to be stable endometrial mass measuring 9  x 4 mm.  The purpose of measuring this was to make sure it had not  appreciably changed to be concerning for a cancer.  The patient was advised  to undergo D&C hysteroscopy.  This surgery including anesthetic  complication, hemorrhage, infection, damage to adjacent structures including  bladder, bowel, blood vessels, ureter discussed with the patient.  She was  made aware of the risk of uterine perforation which could result in  overwhelming and life threatening hemorrhage requiring emergent hysterectomy  or  uterine perforation which could result in bowel damage requiring emergent  colostomy which could result in overwhelming life threatening matters.  She  expressed understanding of and acceptance of these risks and desired to  proceed with surgery.  We did obtain surgical clearance from Dr. Katrinka Blazing who  said that she had a normal Cardiolite stress test April 27, 2005.  He had  not seen her since that time.  He cleared her for surgery from a cardiac  standpoint, assuming no change in clinical status.  We subsequently talked  with Dr. Earl Gala, who is her primary care physician, who thought she should  be average cardiovascular risk as she had no cardiovascular  contraindications for her planned surgery.   PAST GYN HISTORY:  Menarche at age 77.   PAST MEDICAL HISTORY:  1.  Hypercholesterolemia.  2.  Hypertension.  3.  Impaired fasting glucose.  4.  Hyperparathyroidism for which she underwent surgery.   PAST SURGICAL HISTORY:  1.  Breast reduction mammoplasty in 2001.  2.  Knee surgery in 2002.  ALLERGIES:  DIFLUCAN causes swelling.   MEDICATIONS:  1.  Benicar 20 mg daily.  2.  Calcium 1000 mg daily.  3.  Multivitamins daily.  4.  Crestor 5 mg daily.   FAMILY HISTORY:  There is no family history of colon or prostate cancer.  The patient's mother died at 21 of respiratory failure.  Her father died at  75 of heart disease.  She has 3 siblings ranging in age from 21 to 79.  Her  11 year old brother has a history of heart disease.  One sister, age 63, is  alive and well.  Three children alive and well.   SOCIAL HISTORY:  Occupation: Retired.  Tobacco: None.  Alcohol: Occasional  wine.   REVIEW OF SYSTEMS:  Noncontributory except as noted above.  Denies  headaches, visual changes, chest pain, shortness of breath, abdominal pain,  change in bowel habits, unintentional weight loss, dysuria, urgency,  frequency, vaginal introitus discharge, pain or bleeding with intercourse.    PHYSICAL EXAMINATION:  GENERAL APPEARANCE:  Well-developed Caucasian female.  VITAL SIGNS: Blood pressure 154/90, heart rate 64, weight 165.  HEENT: Normal.  NECK:  Supple without thyromegaly, adenopathy, or nodules.  CHEST:  Clear to auscultation.  BREASTS: Symmetrical without masses.  No sign of infection or nipple  discharge.  CARDIAC:  Regular rate and rhythm without extra sounds or murmurs.  ABDOMEN: Soft and nontender.  No hepatosplenomegaly or masses.  EXTREMITIES:  No cyanosis, clubbing, or edema.  NEUROLOGIC: Oriented x3.  Grossly normal.  PELVIC:  Normal external female genitalia.  No vulvar, vaginal, or cervical  lesions.  Pap smear performed September 25, 2005, was within normal limits.  Bimanual careful examination revealed the patient has second degree  cystocele, first degree rectocele, minimal uterine descensus.  Bimanual  examination reveals BUS to be normal, vulva nontender without any adnexal  mass.  RECTAL:  Excellent sphincter tone.  Confirming pelvic exam.  No masses  palpated.   IMPRESSION AND PLAN:  The patient is a 77 year old, gravida 3, para 3,  Caucasian female with endometrial polyps, admitted for [facility] hysteroscopy and  polypectomy.  This has been explained to the patient as well as risks of an  incomplete procedure or risk of being unable to remove any of the polyps.  She expressed understanding of and acceptance of these risks and desires to  proceed with surgery.  All of the patient's questions were answered.  The  information appeared to be completely understood.  The patient desires to  have planned procedure.           ______________________________  Artist Pais, M.D.     DC/MEDQ  D:  11/12/2005  T:  11/12/2005  Job:  [id number]

## 2010-09-13 ENCOUNTER — Other Ambulatory Visit: Payer: Self-pay | Admitting: Internal Medicine

## 2010-09-13 DIAGNOSIS — Z1231 Encounter for screening mammogram for malignant neoplasm of breast: Secondary | ICD-10-CM

## 2010-10-31 ENCOUNTER — Ambulatory Visit
Admission: RE | Admit: 2010-10-31 | Discharge: 2010-10-31 | Disposition: A | Discharge status: Home / Self Care | Payer: BC Managed Care – PPO | Source: Ambulatory Visit | Attending: Internal Medicine | Admitting: Internal Medicine

## 2010-10-31 DIAGNOSIS — Z1231 Encounter for screening mammogram for malignant neoplasm of breast: Secondary | ICD-10-CM

## 2011-01-05 LAB — COMPREHENSIVE METABOLIC PANEL
ALT: 20
Albumin: 3.2 — ABNORMAL LOW
Alkaline Phosphatase: 67
BUN: 17
Chloride: 99
Glucose, Bld: 127 — ABNORMAL HIGH
Potassium: 4
Sodium: 133 — ABNORMAL LOW
Total Bilirubin: 0.7
Total Protein: 6.4

## 2011-01-05 LAB — CROSSMATCH
ABO/RH(D): O POS
Antibody Screen: NEGATIVE

## 2011-01-05 LAB — BLOOD GAS, ARTERIAL
Acid-base deficit: 3.3 — ABNORMAL HIGH
Bicarbonate: 21.4
FIO2: 0.5
MECHVT: 550
O2 Saturation: 99.1
Patient temperature: 98.8
TCO2: 20.4
pH, Arterial: 7.348 — ABNORMAL LOW

## 2011-01-05 LAB — CULTURE, BLOOD (ROUTINE X 2): Culture: NO GROWTH

## 2011-01-05 LAB — ANAEROBIC CULTURE

## 2011-01-05 LAB — CBC
HCT: 25.4 — ABNORMAL LOW
HCT: 33.4 — ABNORMAL LOW
Hemoglobin: 11.6 — ABNORMAL LOW
MCHC: 34.4
MCHC: 34.7
MCV: 93.7
MCV: 94.6
Platelets: 199
Platelets: 253
RBC: 3.56 — ABNORMAL LOW
RDW: 12.7
WBC: 12.8 — ABNORMAL HIGH
WBC: 15.6 — ABNORMAL HIGH

## 2011-01-05 LAB — URINE CULTURE
Colony Count: NO GROWTH
Culture: NO GROWTH

## 2011-01-05 LAB — COMPREHENSIVE METABOLIC PANEL WITH GFR
AST: 16
CO2: 25
Calcium: 8.3 — ABNORMAL LOW
Creatinine, Ser: 0.97
GFR calc Af Amer: >60
GFR calc non Af Amer: 56 — ABNORMAL LOW

## 2011-01-05 LAB — DIFFERENTIAL
Basophils Absolute: 0
Basophils Absolute: 0
Basophils Relative: 0
Eosinophils Absolute: 0
Eosinophils Relative: 0
Lymphocytes Relative: 4 — ABNORMAL LOW
Lymphs Abs: 0.3 — ABNORMAL LOW
Lymphs Abs: 0.6 — ABNORMAL LOW
Monocytes Absolute: 0.3
Monocytes Absolute: 0.5
Monocytes Relative: 2 — ABNORMAL LOW
Monocytes Relative: 4
Neutro Abs: 11.7 — ABNORMAL HIGH
Neutro Abs: 15 — ABNORMAL HIGH
Neutrophils Relative %: 92 — ABNORMAL HIGH

## 2011-01-05 LAB — URINALYSIS, ROUTINE W REFLEX MICROSCOPIC
Glucose, UA: NEGATIVE
Hgb urine dipstick: NEGATIVE
Nitrite: NEGATIVE
Protein, ur: NEGATIVE
Specific Gravity, Urine: 1.031 — ABNORMAL HIGH
Urobilinogen, UA: 1
pH: 6

## 2011-01-05 LAB — SYNOVIAL CELL COUNT + DIFF, W/ CRYSTALS
Crystals, Fluid: NONE SEEN
Lymphocytes-Synovial Fld: 6
Monocyte-Macrophage-Synovial Fluid: 2 — ABNORMAL LOW
Neutrophil, Synovial: 92 — ABNORMAL HIGH
WBC, Synovial: 84700 — ABNORMAL HIGH

## 2011-01-05 LAB — BODY FLUID CULTURE

## 2011-01-05 LAB — ABO/RH: ABO/RH(D): O POS

## 2011-01-05 LAB — WOUND CULTURE: Culture: NO GROWTH

## 2011-01-05 LAB — GRAM STAIN

## 2011-01-05 LAB — C-REACTIVE PROTEIN: CRP: 7.8 — ABNORMAL HIGH (ref <0.6)

## 2011-01-06 LAB — URINALYSIS, ROUTINE W REFLEX MICROSCOPIC
Bilirubin Urine: NEGATIVE
Glucose, UA: NEGATIVE
Ketones, ur: NEGATIVE
Protein, ur: 30 — AB

## 2011-01-06 LAB — BASIC METABOLIC PANEL
BUN: 11
BUN: 17
BUN: 18
BUN: 19
CO2: 22
CO2: 24
CO2: 25
CO2: 25
CO2: 29
Calcium: 6.1 — CL
Calcium: 7.2 — ABNORMAL LOW
Calcium: 7.4 — ABNORMAL LOW
Calcium: 7.5 — ABNORMAL LOW
Chloride: 105
Chloride: 106
Chloride: 111
Chloride: 111
Creatinine, Ser: 0.72
Creatinine, Ser: 0.79
Creatinine, Ser: 0.87
Creatinine, Ser: 0.92
Creatinine, Ser: 1.01
Creatinine, Ser: 1.34 — ABNORMAL HIGH
GFR calc Af Amer: 46 — ABNORMAL LOW
GFR calc Af Amer: >60
GFR calc Af Amer: >60
GFR calc non Af Amer: 53 — ABNORMAL LOW
GFR calc non Af Amer: >60
Glucose, Bld: 105 — ABNORMAL HIGH
Glucose, Bld: 113 — ABNORMAL HIGH
Glucose, Bld: 113 — ABNORMAL HIGH
Potassium: 3.5
Potassium: 3.7
Sodium: 139

## 2011-01-06 LAB — BLOOD GAS, ARTERIAL
Acid-base deficit: 0.5
Acid-base deficit: 1.7
Bicarbonate: 21.4
Bicarbonate: 23.1
FIO2: 0.4
FIO2: 0.4
O2 Saturation: 98.6
Patient temperature: 98.6
RATE: 14
TCO2: 20
TCO2: 21.7
pCO2 arterial: 32.2 — ABNORMAL LOW
pH, Arterial: 7.439 — ABNORMAL HIGH
pO2, Arterial: 103 — ABNORMAL HIGH

## 2011-01-06 LAB — CBC
HCT: 25.2 — ABNORMAL LOW
HCT: 28.8 — ABNORMAL LOW
HCT: 31.9 — ABNORMAL LOW
Hemoglobin: 10.3 — ABNORMAL LOW
Hemoglobin: 8.7 — ABNORMAL LOW
MCHC: 34.1
MCHC: 34.1
MCHC: 34.2
MCHC: 34.5
MCHC: 34.7
MCHC: 34.9
MCHC: 35.3
MCV: 91.2
MCV: 91.8
MCV: 92.8
MCV: 93.8
MCV: 94
Platelets: 197
Platelets: 206
Platelets: 219
Platelets: 345
RBC: 2.69 — ABNORMAL LOW
RBC: 2.74 — ABNORMAL LOW
RBC: 3.16 — ABNORMAL LOW
RDW: 14.1
RDW: 14.3
RDW: 14.5
WBC: 11.7 — ABNORMAL HIGH
WBC: 15.3 — ABNORMAL HIGH
WBC: 19.4 — ABNORMAL HIGH

## 2011-01-06 LAB — CARBOXYHEMOGLOBIN
Carboxyhemoglobin: 1.7 — ABNORMAL HIGH
Methemoglobin: 1.1

## 2011-01-06 LAB — PHOSPHORUS: Phosphorus: 4.4

## 2011-01-06 LAB — PREPARE RBC (CROSSMATCH)

## 2011-01-06 LAB — VANCOMYCIN, TROUGH: Vancomycin Tr: 5.6

## 2011-01-06 LAB — URINE CULTURE
Colony Count: NO GROWTH
Special Requests: NEGATIVE

## 2011-01-06 LAB — B-NATRIURETIC PEPTIDE (CONVERTED LAB): Pro B Natriuretic peptide (BNP): 309 — ABNORMAL HIGH

## 2011-01-06 LAB — MAGNESIUM
Magnesium: 1.6
Magnesium: 1.8

## 2011-01-09 LAB — DIFFERENTIAL
Eosinophils Absolute: 0.1
Eosinophils Relative: 1
Lymphs Abs: 1.7
Monocytes Relative: 11
Neutrophils Relative %: 64

## 2011-01-09 LAB — BASIC METABOLIC PANEL
BUN: 13
Calcium: 9.5
GFR calc non Af Amer: >60
Glucose, Bld: 136 — ABNORMAL HIGH
Sodium: 135

## 2011-01-09 LAB — URINALYSIS, ROUTINE W REFLEX MICROSCOPIC
Glucose, UA: NEGATIVE
Hgb urine dipstick: NEGATIVE
Protein, ur: NEGATIVE

## 2011-01-09 LAB — CBC
HCT: 33.2 — ABNORMAL LOW
MCV: 91.1
RBC: 3.65 — ABNORMAL LOW
WBC: 7.1

## 2011-01-31 ENCOUNTER — Other Ambulatory Visit (HOSPITAL_COMMUNITY): Payer: Self-pay | Admitting: Obstetrics and Gynecology

## 2011-01-31 DIAGNOSIS — C541 Malignant neoplasm of endometrium: Secondary | ICD-10-CM

## 2011-02-06 ENCOUNTER — Ambulatory Visit
Admission: RE | Admit: 2011-02-06 | Discharge: 2011-02-06 | Disposition: A | Discharge status: Home / Self Care | Payer: Medicare Other | Source: Ambulatory Visit | Attending: Radiation Oncology | Admitting: Radiation Oncology

## 2011-02-06 ENCOUNTER — Encounter (HOSPITAL_COMMUNITY)
Admission: RE | Admit: 2011-02-06 | Discharge: 2011-02-06 | Disposition: A | Discharge status: Home / Self Care | Payer: Medicare Other | Source: Ambulatory Visit | Attending: Obstetrics and Gynecology | Admitting: Obstetrics and Gynecology

## 2011-02-06 DIAGNOSIS — Z006 Encounter for examination for normal comparison and control in clinical research program: Secondary | ICD-10-CM | POA: Insufficient documentation

## 2011-02-06 DIAGNOSIS — N289 Disorder of kidney and ureter, unspecified: Secondary | ICD-10-CM | POA: Insufficient documentation

## 2011-02-06 DIAGNOSIS — K7689 Other specified diseases of liver: Secondary | ICD-10-CM | POA: Insufficient documentation

## 2011-02-06 DIAGNOSIS — C549 Malignant neoplasm of corpus uteri, unspecified: Secondary | ICD-10-CM | POA: Insufficient documentation

## 2011-02-06 DIAGNOSIS — R911 Solitary pulmonary nodule: Secondary | ICD-10-CM | POA: Insufficient documentation

## 2011-02-06 DIAGNOSIS — Z510 Encounter for antineoplastic radiation therapy: Secondary | ICD-10-CM | POA: Insufficient documentation

## 2011-02-06 DIAGNOSIS — Z90710 Acquired absence of both cervix and uterus: Secondary | ICD-10-CM | POA: Insufficient documentation

## 2011-02-06 DIAGNOSIS — C7982 Secondary malignant neoplasm of genital organs: Secondary | ICD-10-CM | POA: Insufficient documentation

## 2011-02-06 DIAGNOSIS — C541 Malignant neoplasm of endometrium: Secondary | ICD-10-CM

## 2011-02-06 LAB — GLUCOSE, CAPILLARY: Glucose-Capillary: 127 mg/dL — ABNORMAL HIGH (ref 70–99)

## 2011-02-06 MED ORDER — FLUDEOXYGLUCOSE F - 18 (FDG) INJECTION
17.1000 | Freq: Once | INTRAVENOUS | Status: AC | PRN
  Administered 2011-02-06: 17.1 via INTRAVENOUS

## 2011-02-20 ENCOUNTER — Ambulatory Visit
Admission: RE | Admit: 2011-02-20 | Discharge: 2011-02-20 | Disposition: A | Discharge status: Home / Self Care | Payer: Medicare Other | Source: Ambulatory Visit | Attending: Radiation Oncology | Admitting: Radiation Oncology

## 2011-02-21 ENCOUNTER — Ambulatory Visit
Admission: RE | Admit: 2011-02-21 | Discharge: 2011-02-21 | Disposition: A | Discharge status: Home / Self Care | Payer: Medicare Other | Source: Ambulatory Visit | Attending: Radiation Oncology | Admitting: Radiation Oncology

## 2011-02-21 DIAGNOSIS — C541 Malignant neoplasm of endometrium: Secondary | ICD-10-CM

## 2011-02-21 NOTE — Procedures (Signed)
DIAGNOSIS:  Recurrent endometrial cancer.  NARRATIVE:  Earlier today [name] underwent film verification of her setup directed at the lower pelvis area.  The patient's isocenter was accurately placed and the multileaf collimators contoured the target volume accurately on all fields imaged.    ______________________________ [name], Ph.D., M.D. JDK/MEDQ  D:  02/20/2011  T:  02/21/2011  Job:  [id number]

## 2011-02-22 ENCOUNTER — Encounter: Payer: Self-pay | Admitting: *Deleted

## 2011-02-22 ENCOUNTER — Ambulatory Visit
Admission: RE | Admit: 2011-02-22 | Discharge: 2011-02-22 | Disposition: A | Discharge status: Home / Self Care | Payer: Medicare Other | Source: Ambulatory Visit | Attending: Radiation Oncology | Admitting: Radiation Oncology

## 2011-02-22 ENCOUNTER — Encounter: Payer: Self-pay | Admitting: Radiation Oncology

## 2011-02-22 DIAGNOSIS — C541 Malignant neoplasm of endometrium: Secondary | ICD-10-CM | POA: Insufficient documentation

## 2011-02-22 NOTE — Progress Notes (Signed)
Still slight bleeding ,wears panty shield

## 2011-02-22 NOTE — Progress Notes (Signed)
CC:   Pamela Guthrie, MD [name], M.D.   DIAGNOSIS:  Recurrent endometrial cancer.  NARRATIVE:  [name] is seen today for weekly assessment.  She has completed 3/25 planned external beam radiation treatments directed at the lower pelvis region.  The patient has noticed some very mild nausea, which she attributes to anxiety.  The patient denies any urination difficulties, vaginal bleeding, or bowel complaints.  PHYSICAL EXAMINATION:  Lungs:  Clear.  Heart:  Regular rhythm and rate. Abdomen:  Soft and nontender with normal bowel sounds.  The patient's weight is 167 pounds.  IMPRESSION AND PLAN:  The patient is tolerating her treatments well thus far.  The patient's radiation fields are setting up accurately.  The patient's radiation chart was checked today.  The plan is to continue to a cumulative external beam dose of 4,500 cGy followed by between 3 and 4 intracavitary brachytherapy sessions directed at the distal anterior vaginal area where the patient's recurrence is located.    ______________________________ [name], Ph.D., M.D. JDK/MEDQ  D:  02/22/2011  T:  02/22/2011  Job:  [id number]

## 2011-02-23 ENCOUNTER — Encounter: Payer: Self-pay | Admitting: *Deleted

## 2011-02-23 ENCOUNTER — Ambulatory Visit
Admission: RE | Admit: 2011-02-23 | Discharge: 2011-02-23 | Disposition: A | Discharge status: Home / Self Care | Payer: Medicare Other | Source: Ambulatory Visit | Attending: Radiation Oncology | Admitting: Radiation Oncology

## 2011-02-24 ENCOUNTER — Ambulatory Visit
Admission: RE | Admit: 2011-02-24 | Discharge: 2011-02-24 | Disposition: A | Discharge status: Home / Self Care | Payer: Medicare Other | Source: Ambulatory Visit | Attending: Radiation Oncology | Admitting: Radiation Oncology

## 2011-02-27 ENCOUNTER — Ambulatory Visit
Admission: RE | Admit: 2011-02-27 | Discharge: 2011-02-27 | Disposition: A | Discharge status: Home / Self Care | Payer: Medicare Other | Source: Ambulatory Visit | Attending: Radiation Oncology | Admitting: Radiation Oncology

## 2011-02-27 DIAGNOSIS — C541 Malignant neoplasm of endometrium: Secondary | ICD-10-CM

## 2011-02-27 NOTE — Progress Notes (Signed)
Pt started with hemrrhoid bleedeing yesterday, not doing anything for it as yet, painful, no bleeding in vagina

## 2011-02-27 NOTE — Progress Notes (Signed)
[facility] Radiation Oncology Weekly Treatment Note Dx: Recurrent Endometrial Cancer   Name: [name] Date: 02/27/2011 MRN: [id number] DOB: 1928/03/01  Status:outpatient    Current dose: 1080  Current fraction:6  Planned dose:4500  Planned fraction:25   MEDICATI   ALLERGIES: Review of patient's allergies indicates no known allergies.       NARRATIVE: [name] was seen today for weekly treatment management. The chart was checked and port films images were reviewed.  PHYSICAL EXAMINATION: lungs clear, heart rrr, abd. Soft non tender.    ASSESSMENT: Patient tolerating treatments well, except hemorrhoid flare.  Rec. Hemorrhoidal cream, consider suppositories if worsens.   PLAN: Continue treatment as planned.

## 2011-02-28 ENCOUNTER — Ambulatory Visit
Admission: RE | Admit: 2011-02-28 | Discharge: 2011-02-28 | Disposition: A | Discharge status: Home / Self Care | Payer: Medicare Other | Source: Ambulatory Visit | Attending: Radiation Oncology | Admitting: Radiation Oncology

## 2011-03-01 ENCOUNTER — Ambulatory Visit
Admission: RE | Admit: 2011-03-01 | Discharge: 2011-03-01 | Disposition: A | Discharge status: Home / Self Care | Payer: Medicare Other | Source: Ambulatory Visit | Attending: Radiation Oncology | Admitting: Radiation Oncology

## 2011-03-02 ENCOUNTER — Ambulatory Visit
Admission: RE | Admit: 2011-03-02 | Discharge: 2011-03-02 | Disposition: A | Discharge status: Home / Self Care | Payer: Medicare Other | Source: Ambulatory Visit | Attending: Radiation Oncology | Admitting: Radiation Oncology

## 2011-03-03 ENCOUNTER — Ambulatory Visit
Admission: RE | Admit: 2011-03-03 | Discharge: 2011-03-03 | Disposition: A | Discharge status: Home / Self Care | Payer: Medicare Other | Source: Ambulatory Visit | Attending: Radiation Oncology | Admitting: Radiation Oncology

## 2011-03-04 ENCOUNTER — Ambulatory Visit
Admission: RE | Admit: 2011-03-04 | Discharge: 2011-03-04 | Disposition: A | Discharge status: Home / Self Care | Payer: Medicare Other | Source: Ambulatory Visit | Attending: Radiation Oncology | Admitting: Radiation Oncology

## 2011-03-06 ENCOUNTER — Ambulatory Visit
Admission: RE | Admit: 2011-03-06 | Discharge: 2011-03-06 | Disposition: A | Discharge status: Home / Self Care | Payer: Medicare Other | Source: Ambulatory Visit | Attending: Radiation Oncology | Admitting: Radiation Oncology

## 2011-03-06 ENCOUNTER — Encounter: Payer: Self-pay | Admitting: *Deleted

## 2011-03-06 VITALS — Wt 165.9 lb

## 2011-03-06 DIAGNOSIS — C541 Malignant neoplasm of endometrium: Secondary | ICD-10-CM

## 2011-03-06 NOTE — Progress Notes (Signed)
DIAGNOSIS:  Recurrent endometrial cancer.  NARRATIVE:  [name] is seen today for weekly assessment.  She has completed 2,160 cGy of a planned 4,500 cGy external beam radiation treatments directed at the lower pelvis region.  The patient continues to have problems with her hemorrhoids.  The patient is using topical creams.  In light of her persistence of symptoms and the fact that she does have internal hemorrhoids, I have given her a prescription for Anusol HC suppositories to use twice a day 5 days with 2 refills.  The patient does have some bleeding from her external hemorrhoids.  The patient denies any vaginal bleeding or urination difficulties.  She has had some intermittent problems with cramping and diarrhea.  PHYSICAL EXAMINATION:  Vital Signs:  The patient's weight is 165, which is down a couple pounds since her weigh in last week and down a total of 6 pounds since the start of treatment.  Examination of Lungs:  Reveals them to be clear.  Heart:  Regular rhythm and rate.  Abdomen:  Soft and nontender with normal bowel sounds.  IMPRESSION AND PLAN:  The patient is tolerating her radiation treatments well at this time.  The patient's radiation fields are setting up accurately.  The patient's radiation chart was checked today.  The plan is to continue with external beam followed by intracavitary brachytherapy directed at the distal vaginal.    ______________________________ [name], Ph.D., M.D. JDK/MEDQ  D:  03/06/2011  T:  03/06/2011  Job:  [id number]

## 2011-03-06 NOTE — Progress Notes (Signed)
Pt fatigued over the weekend, cramping in abdomen, no meds needed for pain stated, has  Rectal bleeding for hemrrhoids using preparation h and salt water sitzbaths 9:34 AM

## 2011-03-07 ENCOUNTER — Ambulatory Visit
Admission: RE | Admit: 2011-03-07 | Discharge: 2011-03-07 | Disposition: A | Discharge status: Home / Self Care | Payer: Medicare Other | Source: Ambulatory Visit | Attending: Radiation Oncology | Admitting: Radiation Oncology

## 2011-03-08 ENCOUNTER — Ambulatory Visit
Admission: RE | Admit: 2011-03-08 | Discharge: 2011-03-08 | Disposition: A | Discharge status: Home / Self Care | Payer: Medicare Other | Source: Ambulatory Visit | Attending: Radiation Oncology | Admitting: Radiation Oncology

## 2011-03-13 ENCOUNTER — Ambulatory Visit
Admission: RE | Admit: 2011-03-13 | Discharge: 2011-03-13 | Disposition: A | Discharge status: Home / Self Care | Payer: Medicare Other | Source: Ambulatory Visit | Attending: Radiation Oncology | Admitting: Radiation Oncology

## 2011-03-14 ENCOUNTER — Ambulatory Visit
Admission: RE | Admit: 2011-03-14 | Discharge: 2011-03-14 | Disposition: A | Discharge status: Home / Self Care | Payer: Medicare Other | Source: Ambulatory Visit | Attending: Radiation Oncology | Admitting: Radiation Oncology

## 2011-03-15 ENCOUNTER — Ambulatory Visit
Admission: RE | Admit: 2011-03-15 | Discharge: 2011-03-15 | Disposition: A | Discharge status: Home / Self Care | Payer: Medicare Other | Source: Ambulatory Visit | Attending: Radiation Oncology | Admitting: Radiation Oncology

## 2011-03-16 ENCOUNTER — Ambulatory Visit
Admission: RE | Admit: 2011-03-16 | Discharge: 2011-03-16 | Disposition: A | Discharge status: Home / Self Care | Payer: Medicare Other | Source: Ambulatory Visit | Attending: Radiation Oncology | Admitting: Radiation Oncology

## 2011-03-17 ENCOUNTER — Ambulatory Visit
Admission: RE | Admit: 2011-03-17 | Discharge: 2011-03-17 | Disposition: A | Discharge status: Home / Self Care | Payer: Medicare Other | Source: Ambulatory Visit | Attending: Radiation Oncology | Admitting: Radiation Oncology

## 2011-03-17 VITALS — Wt 167.8 lb

## 2011-03-17 DIAGNOSIS — C541 Malignant neoplasm of endometrium: Secondary | ICD-10-CM

## 2011-03-17 NOTE — Progress Notes (Signed)
STATES THAT SHE IS HAVING DISCHARGE YELLOWISH IN COLOR, SAYS BOTTOM FEELS LIKE SUNBURN

## 2011-03-20 ENCOUNTER — Ambulatory Visit
Admission: RE | Admit: 2011-03-20 | Discharge: 2011-03-20 | Disposition: A | Discharge status: Home / Self Care | Payer: Medicare Other | Source: Ambulatory Visit | Attending: Radiation Oncology | Admitting: Radiation Oncology

## 2011-03-20 VITALS — Wt 167.2 lb

## 2011-03-20 DIAGNOSIS — C541 Malignant neoplasm of endometrium: Secondary | ICD-10-CM

## 2011-03-20 MED ORDER — SILVER SULFADIAZINE 1 % EX CREA
TOPICAL_CREAM | Freq: Two times a day (BID) | CUTANEOUS | Status: DC
  Administered 2011-03-20: 1 via TOPICAL

## 2011-03-20 NOTE — Progress Notes (Signed)
Pt c/o itching and rawness in periarea, bleeding form hemrrhoids, very erythema, pt needs some type of cream, will let Md here see pt,  9:44 AM

## 2011-03-21 ENCOUNTER — Ambulatory Visit
Admission: RE | Admit: 2011-03-21 | Discharge: 2011-03-21 | Disposition: A | Discharge status: Home / Self Care | Payer: Medicare Other | Source: Ambulatory Visit | Attending: Radiation Oncology | Admitting: Radiation Oncology

## 2011-03-21 NOTE — Progress Notes (Signed)
DIAGNOSIS:  Recurrent endometrial cancer.  NARRATIVE:  [name] asked to be seen today.  She has had a lot of irritation in the perineum and perirectal area.  The patient has completed 3600 cGy of a planned 4500 cGy.  PHYSICAL EXAMINATION:  On examination, the patient has inflamed, swollen external hemorrhoids which are non-thrombosed.  In addition, the patient is developing some early skin breakdown along the perineum area without signs of infection.  IMPRESSION AND PLAN:  As above, the patient is having a skin reaction with her treatments.  I have recommended the patient use sitz bath twice a day.  She has not been consistent with this as she is concerned this may irritate her skin more.  I did recommend this twice a day after her treatment.  After the patient does sitz baths, I have also given her Silvadene to place on her areas of involvement.  The patient will be seen again on December 5th for further evaluation.    ______________________________ [name], Ph.D., M.D. JDK/MEDQ  D:  03/20/2011  T:  03/21/2011  Job:  [id number]

## 2011-03-21 NOTE — Progress Notes (Signed)
DIAGNOSIS:  Recurrent endometrial cancer.  NARRATIVE:  [name] is seen today for weekly assessment.  She has completed 3420 cGy of a planned 4500 cGy external beam radiation treatments directed at the lower pelvis region.  The patient is starting to have some discomfort in the perirectal area.  The patient has been using topical hemorrhoidal creams.  The patient was also given Anusol HC suppositories last week which were helpful.  The patient denies any vaginal bleeding or urination difficulties at this time.  She does have some fatigue and has had to cut back on some of her activities.  EXAMINATION:  The patient's weight is 167.8 pounds.  Lungs:  Clear. Heart:  Has a regular rhythm and rate.  Abdomen:  Soft and nontender with normal bowel sounds.  IMPRESSION AND PLAN:  The patient is tolerating her treatments reasonably well except for issues as above.  The patient's radiation fields are setting up accurately.  The patient's radiation chart was checked today.  Plan is to continue to a cumulative dose of 4500 cGy followed by intracavitary brachytherapy treatments.    ______________________________ [name], Ph.D., M.D. JDK/MEDQ  D:  03/17/2011  T:  03/21/2011  Job:  [id number]

## 2011-03-22 ENCOUNTER — Encounter: Payer: Self-pay | Admitting: *Deleted

## 2011-03-22 ENCOUNTER — Ambulatory Visit
Admission: RE | Admit: 2011-03-22 | Discharge: 2011-03-22 | Disposition: A | Discharge status: Home / Self Care | Payer: Medicare Other | Source: Ambulatory Visit | Attending: Radiation Oncology | Admitting: Radiation Oncology

## 2011-03-22 ENCOUNTER — Encounter: Payer: Self-pay | Admitting: Radiation Oncology

## 2011-03-22 ENCOUNTER — Telehealth: Payer: Self-pay | Admitting: *Deleted

## 2011-03-22 VITALS — BP 124/74 | HR 68 | Resp 18 | Wt 165.5 lb

## 2011-03-22 DIAGNOSIS — C541 Malignant neoplasm of endometrium: Secondary | ICD-10-CM

## 2011-03-22 NOTE — Progress Notes (Signed)
DIAGNOSIS:  Recurrent endometrial cancer.  NARRATIVE:  [name] is seen today for further evaluation.  She has completed 3,960 cGy of a planned 4,500 cGy external beam treatments. The patient was seen earlier in the week complaining of a lot of discomfort in the vulvar and perirectal areas.  It was advised at that time that the patient proceed with sitz baths twice a day as well as she was given a prescription for Silvadene.  The patient does notice stinging when placing the Silvadene on but overall feels she has improved with this current regimen.  The patient will continue to a dose of 4,500 cGy followed by intracavitary brachytherapy treatments.    ______________________________ [name], Ph.D., M.D. JDK/MEDQ  D:  03/22/2011  T:  03/22/2011  Job:  [id number]

## 2011-03-22 NOTE — Progress Notes (Signed)
Patient presents to the clinic today unaccompanied for under treat visit with Dr. Roselind Messier. No distress noted. Steady gait noted. Pleasant affect noted. Patient reports that the itching and rawness of her perineal area are beginning to improve. Patient reports the bleeding from her hemorrhoids continues. Patient reports the topical cream Dr. Roselind Messier prescribed on Monday for her hemorrhoids is helping. All findings reported to Dr. Roselind Messier.

## 2011-03-23 ENCOUNTER — Ambulatory Visit
Admission: RE | Admit: 2011-03-23 | Discharge: 2011-03-23 | Disposition: A | Discharge status: Home / Self Care | Payer: Medicare Other | Source: Ambulatory Visit | Attending: Radiation Oncology | Admitting: Radiation Oncology

## 2011-03-24 ENCOUNTER — Ambulatory Visit
Admission: RE | Admit: 2011-03-24 | Discharge: 2011-03-24 | Disposition: A | Discharge status: Home / Self Care | Payer: Medicare Other | Source: Ambulatory Visit | Attending: Radiation Oncology | Admitting: Radiation Oncology

## 2011-03-27 ENCOUNTER — Ambulatory Visit
Admission: RE | Admit: 2011-03-27 | Discharge: 2011-03-27 | Disposition: A | Discharge status: Home / Self Care | Payer: Medicare Other | Source: Ambulatory Visit | Attending: Radiation Oncology | Admitting: Radiation Oncology

## 2011-03-27 VITALS — Wt 165.6 lb

## 2011-03-27 DIAGNOSIS — C541 Malignant neoplasm of endometrium: Secondary | ICD-10-CM

## 2011-03-27 NOTE — Progress Notes (Signed)
Pt had diarrhea, stomach cramping, states from what she ate over weekend.  Advised she take  Immodium if  She cont to have  mucus in her stools.  Completed daily xrt tx today, to begin HDR 04/04/11.

## 2011-03-27 NOTE — Progress Notes (Signed)
DIAGNOSIS:  Recurrent endometrial cancer.  NARRATIVE:  [name] is seen today for weekly assessment.  She completed her last external beam radiation therapy directed at the lower pelvis area totaling 4500 cGy earlier today.  The patient continues to have a lot of hemorrhoidal discomfort.  Since doing her sitz baths twice a day, this seems to be better.  The patient denies any vaginal bleeding at this time or significant urination difficulties.  The patient's weight is stable at 165.6 pounds.  IMPRESSION AND PLAN:  The patient is tolerating her treatments reasonably well except for issues outlined on previous under treatment notes.  The patient be on break for the next week.  I have recommended that she do a sitz bath 2-3 times a day which seems to be most helpful for her at this time.  Next week the patient will begin her brachytherapy directed at the distal vaginal region.    ______________________________ [name], Ph.D., M.D. JDK/MEDQ  D:  03/27/2011  T:  03/27/2011  Job:  [id number]

## 2011-03-28 ENCOUNTER — Ambulatory Visit: Payer: Medicare Other

## 2011-04-03 ENCOUNTER — Telehealth: Payer: Self-pay | Admitting: *Deleted

## 2011-04-04 ENCOUNTER — Ambulatory Visit
Admission: RE | Admit: 2011-04-04 | Discharge: 2011-04-04 | Disposition: A | Discharge status: Home / Self Care | Payer: Medicare Other | Source: Ambulatory Visit | Attending: Radiation Oncology | Admitting: Radiation Oncology

## 2011-04-04 ENCOUNTER — Encounter: Payer: Self-pay | Admitting: *Deleted

## 2011-04-04 ENCOUNTER — Encounter: Payer: Self-pay | Admitting: Radiation Oncology

## 2011-04-04 VITALS — BP 142/73 | HR 78 | Temp 97.4°F | Resp 20 | Wt 165.6 lb

## 2011-04-04 DIAGNOSIS — C541 Malignant neoplasm of endometrium: Secondary | ICD-10-CM

## 2011-04-04 NOTE — Procedures (Signed)
CC:   [name], MD  DIAGNOSIS:  Recurrent endometrial cancer.  NARRATIVE:  Earlier today, Mrs. Agostino underwent examination and planning and her 1st high-dose rate treatment.  Below is the documentation of the events involved with this process.  VAGINAL BRACHYTHERAPY PROCEDURE:  The patient was taken to the nursing suite and placed on the GYN examination table.  On pelvic examination, the patient was noted to have shrinkage of her distal vaginal anterior wall recurrence. This is estimated to be approximately 1.5 to 2 cm in size.  The lesion was less of a stalk area as previously noted and smaller in size, estimated to be approximately 50% of its is pre- external beam size.  The patient then had Betadine placed throughout the vaginal vault.  The patient proceeded to have placement of 2 gold seed markers at the area of recurrence in the distal anterior vagina.  The patient then proceeded to undergo fitting for her vaginal cylinder.  The patient's previously used cylinder was a 3.5-cm diameter ring, was too large given the patient's prior brachytherapy and recent external beam treatments.  The patient did not tolerate this size without undue discomfort.  The patient then had arrangement of three 3.0-cm rings placed proximally within the vaginal vault. More distally, were two 2.5- cm rings.  The patient did have some discomfort with the overall procedure but tolerated it reasonably well.  The patient's vaginal cylinder was subsequently removed.  COMPLEX SIMULATION AND TREATMENT PLANNING:  The patient was then transferred to the CT simulation suite.  The patient's vaginal cylinder was then placed in the vaginal vault.  A rectal tube was placed with contrast instilled into the rectal vault.  The patient also had a fiducial marker placed within the vaginal cylinder.  She then proceeded to undergo CT scan in the treatment position.  Good images of the vaginal cylinder as well as  fiducial markers within the anterior vaginal wall as well as the bladder and rectum were noted.  The patient then subsequently had planning for her 1st high-dose rate treatment.  PLAN:  The patient is to proceed with 3 weekly intracavitary brachytherapy treatments.  This treatment will be more distal within the vaginal vault, than the patient's previous intracavitary treatments. There, however, will be some anticipated overlap with her prior intracavitary brachytherapy treatments.  The patient will receive approximately 600 cGy to the mucosal surface with 3 weekly treatments and brachytherapy  dose of 1800 cGy.  SIMPLE TREATMENT DEVICE:  The patient did have construction of a custom vaginal cylinder with 5 separate vaginal rings.  The patient will be treated with 3.0-cm diameter ring more proximal within the vaginal vault.  VERIFICATION AND SIMULATION:  The patient was then transferred to the high-dose rate suite after completion of her radiation plan.  The patient's vaginal cylinder was then reinserted.  A fiducial marker was placed within the vaginal cylinder.  An AP and lateral film was obtained in the treatment position.  This was compared to the patient's planning films earlier in the day documenting good position of the vaginal cylinder for treatment.  HIGH-DOSE RATE BRACHYTHERAPY PROCEDURE:  The patient then proceeded to undergo her 1st high-dose rate treatment.  The patient was prescribed a dose of 600 cGy to isodose line.  This plan did show some extension out towards the fiducial markers placed along the area of recurrence in the anterior distal vaginal wall.  Total treatment time was 352 seconds. The patient was treated with a iridium 192 as the  high-dose rate source. Six dwell positions were used to deliver the patient's treatment.  The patient tolerated the procedure well.  After completion of her therapy a radiation survey was performed documenting return of the iridium  source into the Nucletron safe.  The patient will return in approximately 10 days for her 2nd high-dose rate treatment.    ______________________________ [name], Ph.D., M.D. JDK/MEDQ  D:  04/04/2011  T:  04/04/2011  Job:  [id number]

## 2011-04-04 NOTE — Progress Notes (Signed)
Please see the Nurse Progress Note in the MD Initial Consult Encounter for this patient. 

## 2011-04-04 NOTE — Progress Notes (Signed)
Pt had gold seeds x 2 placed by MD, then was fitted with cylinder starting numbers# [id number], pt tolerated fair, some bleeding,offerred warm moist washcloth and towel ,then  mesh panties with pad, pt was then escorted  To ct sim, for plannng for tx today at 1pm,pt belongings placed in bag and went with pt 10:18 AM

## 2011-04-04 NOTE — Progress Notes (Signed)
Pt here for HDR , took vitals, no new meds, still having loose stools, upset stomach  Stated, no discharge or bleeding or pain stated 8:01 AM

## 2011-04-14 ENCOUNTER — Telehealth: Payer: Self-pay | Admitting: *Deleted

## 2011-04-17 ENCOUNTER — Encounter: Payer: Self-pay | Admitting: Radiation Oncology

## 2011-04-17 ENCOUNTER — Ambulatory Visit
Admission: RE | Admit: 2011-04-17 | Discharge: 2011-04-17 | Disposition: A | Discharge status: Home / Self Care | Payer: Medicare Other | Source: Ambulatory Visit | Attending: Radiation Oncology | Admitting: Radiation Oncology

## 2011-04-17 VITALS — BP 159/83 | HR 73 | Temp 98.1°F | Resp 20 | Wt 169.6 lb

## 2011-04-17 DIAGNOSIS — C541 Malignant neoplasm of endometrium: Secondary | ICD-10-CM

## 2011-04-17 NOTE — Progress Notes (Signed)
Pt here for HDr Loraine Leriche and start, vitals obtained, pt de4nys pain, but having some rectal bleeding and loose bowels stated,"some better this week,but embarrassing"

## 2011-04-17 NOTE — Procedures (Signed)
DIAGNOSIS:  Recurrent endometrial cancer.  Below is the summary of the patient's planning and treatment earlier today for her 2nd high-dose rate treatment.  VAGINAL BRACHYTHERAPY PROCEDURE:  The patient was taken to the nursing station and placed on the GYN examination table.  On pelvic examination, the patient continued to have a palpable mass in the distal anterior vaginal vault.  There was no active bleeding noted in this region.  The patient then proceeded to undergo serial dilatation of the vaginal introitus.  Given the fact that the patient's area of recurrence was located anteriorly within the distal vagina, the patient had setup of a custom lead shield to limit dose to the rectum region and posterior vaginal wall.  The patient with the assistance of physics had development of a custom lead shield which involved 5 custom molded lead sheets of approximately 2 mm.  This was then covered with "lead puddy." This was then covered with a condom catheter.  The patient then had placement inside the custom shields of a 2.0 cm diameter vaginal ring. This was then taped together to prevent slippage.  The patient then had a condom catheter placed over the 2 cm diameter vaginal cylinder as well as the custom shield.  Under careful exam and placement, the vaginal cylinder and shield looked to be in excellent position within the vaginal vault.  The vaginal cylinder and shield were subsequently removed without difficulty.  The patient overall tolerated the procedure well.  The patient was then taken to the CT simulator room where the patient had re-insertion of her vaginal cylinder.  A CT scan through the lower pelvis area was performed.  There was some artifact from her lead shield but good images overall were obtained.  The patient's previously-placed fiducial markers within the area of recurrence in the distal anterior vaginal region were noted.  The patient's fiducial marker within  the vaginal cylinder was also noted superiorly.  The patient subsequently had planning for her 2nd high-dose rate treatment.  The prescription was first dosed of 600 cGy.  There was reduction of coverage superior within the vaginal vault in light of the patient's prior high-dose rate treatment directed at the vaginal cuff region.  The patient tolerated her CT scan well.  HIGH-DOSE RATE BRACHYTHERAPY PROCEDURE:  After planning of the patient's treatments, she then was taken to the high-dose rate suite.  The vaginal cylinder was then inserted.  A fiducial marker was also placed.  The patient then had verification stimulation with an AP and lateral film obtained in the treatment position.  This was compared to the patient's planning films earlier in the day, showing good position of the vaginal cylinder for treatment.  The patient then had attachment of the remote afterloading catheter to the vaginal cylinder.  The patient proceeded to undergo her 2nd treatment directed at the distal anterior vaginal vault.  The patient was prescribed a dose of 600 cGy to the anterior vaginal mucosal surface.  This was achieved with a total dwell time of 98.5 seconds. The patient was treated with 6 dwell positions using 1 channel.  Iridium- 192 with a high-dose rate source.  After completion of the patient's therapy, a radiation survey was performed, documenting return of the iridium source into the Nucletron safe.  The patient's vaginal cylinder and lead shield were subsequently removed without difficulty.  There was no bleeding noted.  The patient tolerated the overall procedure and treatment well.  The patient will return next week for her third high-dose  rate treatment.    ______________________________ [name], Ph.D., M.D. JDK/MEDQ  D:  04/17/2011  T:  04/17/2011  Job:  [id number]

## 2011-04-17 NOTE — Progress Notes (Signed)
Please see the Nurse Progress Note in the MD Initial Consult Encounter for this patient. 

## 2011-04-24 ENCOUNTER — Telehealth: Payer: Self-pay | Admitting: *Deleted

## 2011-04-25 ENCOUNTER — Ambulatory Visit
Admission: RE | Admit: 2011-04-25 | Discharge: 2011-04-25 | Disposition: A | Discharge status: Home / Self Care | Payer: Medicare Other | Source: Ambulatory Visit | Attending: Radiation Oncology | Admitting: Radiation Oncology

## 2011-04-25 DIAGNOSIS — C541 Malignant neoplasm of endometrium: Secondary | ICD-10-CM

## 2011-04-25 NOTE — Procedures (Signed)
DIAGNOSIS:  Recurrent endometrial cancer.  Below is a summary of the patient's simulation and treatment for her 3rd high-dose rate treatment.  VAGINAL BRACHYTHERAPY PROCEDURE:  The pt. was placed on the CT simulator table in the supine position.  The patient proceeded to undergo examination.  The patient continued to have a palpable mass within the distal anterior vaginal area.  The patient proceeded to undergo serial dilatation of the vaginal introitus.  The patient's previously- constructed vaginal cylinder with associated lead shielding placed posteriorly was placed in the proximal vagina.  The cylinder was then affixed to the CT/MR stabilization plate to prevent slippage.  The patient tolerated the procedure well.  COMPLEX SIMULATION:  The patient then proceeded to undergo a CT scan in the treatment position.  Under virtual simulation, a good view of the vaginal cylinder with associated fiducial markers was noted.  There was some artifact from the lead shielding in the posterior vaginal wall.  However, adequate images were obtained for planning for the patient's 3rd high-dose rate treatment.  The patient did have outlining of the bladder and rectum as well as vaginal cylinder for planning for her 3rd high-dose rate treatment.  The patient subsequently had planning and will receive 600 cGy to the anterior vaginal distal vaginal mucosal surface.  VERIFICATION SIMULATION:  After planning for the patient's treatment, she was subsequently transferred to the high-dose rate suite.  The patient's vaginal cylinder was then reinserted with a fiducial marker in place.  The patient then proceeded to have an AP and lateral film in the treatment position.  This was compared to the patient's planning films earlier in the day, showing good position of the vaginal cylinder for treatment.  HIGH-DOSE RATE BRACHYTHERAPY PROCEDURE:  The patient then had attachment of the vaginal cylinder to the  remote afterloading catheter.  The patient then proceeded to undergo her 3rd high-dose rate treatment.  The patient was prescribed a dose of 600 cGy to the anterior distal vaginal mucosal surface.  This was achieved with a total dwell time of 106.7 seconds.  The patient was treated with 1 channel using a iridium 192 as the high-dose rate source.  The patient was treated with 7 dwell positions.  The patient overall tolerated the procedure well.  After completion of her therapy, a radiation survey was performed documenting return of the iridium source into the Nucletron safe.    ______________________________ [name], Ph.D., M.D. JDK/MEDQ  D:  04/25/2011  T:  04/25/2011  Job:  [id number]

## 2011-05-01 ENCOUNTER — Telehealth: Payer: Self-pay | Admitting: *Deleted

## 2011-05-02 ENCOUNTER — Ambulatory Visit: Payer: Medicare Other | Admitting: Radiation Oncology

## 2011-05-02 ENCOUNTER — Ambulatory Visit
Admission: RE | Admit: 2011-05-02 | Discharge: 2011-05-02 | Disposition: A | Discharge status: Home / Self Care | Payer: Medicare Other | Source: Ambulatory Visit | Attending: Radiation Oncology | Admitting: Radiation Oncology

## 2011-05-02 DIAGNOSIS — C541 Malignant neoplasm of endometrium: Secondary | ICD-10-CM

## 2011-05-02 NOTE — Procedures (Signed)
DIAGNOSIS:  Recurrent endometrial cancer.  NARRATIVE:  Below is a summary of the patient's setup and treatment today.  VAGINAL BRACHYTHERAPY PROCEDURE:  The patient was placed on the high- dose rate treatment table in the dorsal lithotomy position.  The patient proceeded to undergo pelvic examination.  The patient's anterior distal vaginal mass appeared to be smaller on exam but still palpable.  The patient proceeded to undergo serial dilatation of the vaginal introitus. The patient then had insertion of her vaginal cylinder with associated posterior shielding.  The vaginal cylinder was then affixed to the CT/MR stabilization plate to prevent slippage.  The patient tolerated the procedure well.  VERIFICATION SIMULATION:  Patient then had placement of a fiducial marker within the vaginal cylinder.  An AP and lateral film was obtained.  This was compared to the patient's planning films showing good position of the vaginal cylinder for treatment.  HIGH-DOSE RATE BRACHYTHERAPY PROCEDURE:  The patient then had attachment of her vaginal cylinder to the remote afterloading device.  The patient then proceeded to undergo her 4th and final high-dose rate treatment. The patient was prescribed a dose of 600 cGy to be delivered to the anterior distal vaginal mucosal surface.  This was achieved with 6 dwell positions using 1 channel.  Iridium 192 was the high-dose rate source. Total treatment time was 114.2 seconds.  The patient tolerated the procedure well.  After completion of her treatment, a radiation survey was performed documenting return of the iridium source into the Nucletron safe.    ______________________________ [name], Ph.D., M.D. JDK/MEDQ  D:  05/02/2011  T:  05/02/2011  Job:  [id number]

## 2011-05-02 NOTE — Procedures (Signed)
DIAGNOSIS:  Recurrent endometrial cancer.  NARRATIVE:  Earlier today, [name] underwent her 4th and final high-dose rate treatment directed at the distal vagina.  Below is the summary of the a setup and treatment.  VAGINAL BRACHYTHERAPY PROCEDURE:  The patient was placed on the high- dose rate treatment table.  The patient's previously constructed cylinder with associated shielding posteriorly was placed in the vaginal area after examination and dilatation of the vaginal introitus.  On exam, the patient was noted to have some shrinkage of her anterior distal vaginal lesion.  There was no active bleeding noted.  The patient had some mild discomfort with the placement.  The cylinder was then affixed to the CT/MR stabilization plate to prevent slippage.  VERIFICATION SIMULATION:  The patient then had a fiducial marker placed within the vaginal cylinder.  An AP and lateral film was obtained in the treatment position.  This was compared to the patient's planning films showing good position of the vaginal cylinder for treatment.  HIGH-DOSE RATE TREATMENT:  The patient then had attachment of the vaginal cylinder to the remote afterloader using a catheter system.  The patient then proceeded to undergo her 4th high-dose rate treatment. The patient was prescribed dose of 600 cGy to the anterior distal vaginal mucosa.  This was achieved with a total of 6 dwell positions, using 1 channel.  Treatment time was 114.2 seconds.  The patient tolerated the procedure well.  After completion of her therapy a radiation survey was performed documenting return of the iridium source into the Nucletron safe.  This completes the patient's planned course of therapy for her recurrence.  The patient will be set up for followup in 1 month.    ______________________________ [name], Ph.D., M.D. JDK/MEDQ  D:  05/02/2011  T:  05/02/2011  Job:  [id number]

## 2011-05-09 ENCOUNTER — Encounter: Payer: Self-pay | Admitting: Radiation Oncology

## 2011-05-09 NOTE — Progress Notes (Signed)
CC:   Pamela Guthrie, MD [name], M.D.  DIAGNOSIS:  Recurrent endometrial cancer.  INDICATION FOR THERAPY:  Definitive treatment.  TREATMENT DATES: 1. External beam radiation therapy February 20, 2011 through March 27, 2011. 2. Intracavitary brachytherapy treatments December 18th, December     31st, January 8th, and January 15th.  ENERGY/FIELD:  The patient was initially treated with external beam radiation therapy using conformal therapy.  The patient was treated with 18 mV photons.  The patient received 25 treatments at 180 cGy per day for an initial dose of 4500 cGy directed at the lower pelvis region. The patient then proceeded to undergo intracavitary brachytherapy. Prior to the patient's 1st brachytherapy treatment she did have some gold fiducial markers placed within the tumor mass along the distal anterior vagina.  The patient was initially treated with a 3-cm diameter cylinder.  Loading was such that the proximal vagina was not treated, given her prior intracavitary brachytherapy treatments directed at the vaginal cuff.  After the patient's 1st treatment with standard cylinder she had development of a custom lead shield to reduce the dose to the posterior vaginal wall as well as rectum.  The patient was then treated with a 2.0-cm diameter cylinder with above mentioned shielding to provide dose to the site of recurrence but limit dose to the posterior vaginal wall and the rectum area.  The patient received 3 additional treatments with her custom lead shield for a cumulative brachytherapy dose of 2400 cGy and a cumulative dose of 6900 cGy when summing the external beam treatments.  Iridium 192 was used as the high- dose rate source.  The patient was treated with 1 channel using 6 dwell positions for all of her brachytherapy treatments.  NARRATIVE:  [name] did experience some hemorrhoidal discomfort and diarrhea with her external beam treatments.   Further evaluation showed the patient may potentially have a prolapsed rectum and will be seen at a later date concerning this issue.  The patient, symptomatically, was helped with sitz baths and topical hemorrhoidal cream.  The patient did experience some irritation along the perineum which was treated well with Silvadene.  The patient tolerated her intracavitary brachytherapy treatments well. She had some mild discomfort in the vulvovaginal area with these applications.  During the course of her treatment the patient's anterior vaginal wall mass did decrease but was still palpable on the patient's last high-dose rate treatment.  The patient will be seeing Dr. Clifton Ksean Vale for consideration for additional treatment, i.e. chemotherapy in the near future.  FOLLOW UP APPOINTMENT:  One month.   ______________________________ [name], Ph.D., M.D. JDK/MEDQ  D:  05/09/2011  T:  05/09/2011  Job:  [id number]

## 2011-05-29 ENCOUNTER — Ambulatory Visit
Admission: RE | Admit: 2011-05-29 | Discharge: 2011-05-29 | Disposition: A | Discharge status: Home / Self Care | Payer: Medicare Other | Source: Ambulatory Visit | Attending: Radiation Oncology | Admitting: Radiation Oncology

## 2011-05-29 VITALS — BP 138/82 | HR 78 | Temp 98.1°F | Wt 169.6 lb

## 2011-05-29 DIAGNOSIS — C541 Malignant neoplasm of endometrium: Secondary | ICD-10-CM

## 2011-05-29 NOTE — Progress Notes (Signed)
CC:   [name], MD [name], M.D.  DIAGNOSIS:  Recurrent endometrial cancer.  INTERVAL SINCE RADIATION THERAPY:  One month.  NARRATIVE:  Ms. Anand comes in today for routine followup.  She completed external beam and intracavitary brachytherapy treatments for her distal anterior vaginal recurrence.  Clinically the patient seems to be doing much better at this time.  She is having less fatigue.  The patient denies any hematuria or vaginal bleeding.  She does occasionally have some bladder spasms with initiation of urination.  The patient denies any rectal bleeding.  She continues to have discomfort with bowel movements related to her possible prolapsed rectum.  The patient denies any fecal incontinence.  PHYSICAL EXAMINATION:  The patient's temperature is 98.1, pulse 78, blood pressure is 138/82.  Weight is 169 pounds which is stable. Examination of the neck and supraclavicular region reveals no evidence of adenopathy.  The axillary areas are free of adenopathy.  Examination of the lungs reveals them to be clear.  The heart has a regular rhythm and rate.  The abdomen is soft and nontender with normal bowel sounds. On pelvic examination the patient continues to have a mass along the anterior distal vaginal area but overall has decreased in size with her external beam and intracavitary brachytherapy treatments.  The lesion does not bleed easily at this time.  IMPRESSION AND PLAN:  Recurrent endometrial cancer.  As above the patient has had response to her external beam and intracavitary treatments but does appear to have persistent disease.  The patient will be meeting with Dr. Clifton Lonzie Simmer on Thursday, February 14, for further evaluation.  I anticipate the patient will require additional therapy, possibly chemotherapy for her management.  The patient will return for routine followup in radiation oncology in 3 months.    ______________________________ [name],  Ph.D., M.D. JDK/MEDQ  D:  05/29/2011  T:  05/29/2011  Job:  [id number]

## 2011-05-29 NOTE — Progress Notes (Signed)
Here for routine follow 1 month post completion of hdr for endometrial ca. Denies pain. Has some urinary spasms at times. States skin completely healed.

## 2011-05-29 NOTE — Progress Notes (Signed)
Given size small and extra-small dilator and reviewed use.

## 2011-06-09 NOTE — Telephone Encounter (Signed)
xxx

## 2011-06-09 NOTE — Telephone Encounter (Signed)
XXXX 

## 2011-08-16 ENCOUNTER — Telehealth: Payer: Self-pay | Admitting: Oncology

## 2011-08-16 NOTE — Telephone Encounter (Signed)
Referred by Dr. Clifton James Dx- Endometrial Ca

## 2011-08-21 ENCOUNTER — Encounter: Payer: Self-pay | Admitting: Radiation Oncology

## 2011-08-21 ENCOUNTER — Ambulatory Visit
Admission: RE | Admit: 2011-08-21 | Discharge: 2011-08-21 | Disposition: A | Discharge status: Home / Self Care | Payer: Medicare Other | Source: Ambulatory Visit | Attending: Radiation Oncology | Admitting: Radiation Oncology

## 2011-08-21 VITALS — BP 125/72 | HR 68 | Temp 97.6°F | Resp 18 | Wt 171.1 lb

## 2011-08-21 DIAGNOSIS — C541 Malignant neoplasm of endometrium: Secondary | ICD-10-CM

## 2011-08-21 NOTE — Progress Notes (Signed)
HERE FOR FU OF RECURRENT ENDOMETRIAL.  HAS HAD TO WEAR PADS DUE TO DISCHARGE OF BRIGHT RED SINCE TX.  HAS ALSO HAD SOME CRAMPING AND DIARRHEA NOT REQUIRING ANY MEDICATION.  SEES DR. Janae Sauce Friday TO PLAN CHEMO.

## 2011-08-21 NOTE — Progress Notes (Signed)
Radiation Oncology         [phone] ________________________________  Name: [name] MRN: [id number]  Date: 08/21/2011  DOB: 1928-02-22  Follow-Up Visit Note  CC: Cliffton Asters, MD, MD  Seabron Spates, MD  Diagnosis:   Recurrrent Endometrial Cancer  Interval Since Last Radiation:  4 months  Narrative:  The patient returns today for routine follow-up.  She clinically seems to be relatively stable. The patient does have some very mild vaginal bleeding and wears a pad for this issue. Patient did undergo repeat imaging and was noted to have some persistent disease in the anterior vaginal area. In light of this patient will be seeing Dr. Darrold Span  to proceed with chemotherapy.                              ALLERGIES:  is allergic to voltaren.  Meds: Current Outpatient Prescriptions  Medication Sig Dispense Refill  . aspirin 81 MG tablet Take 81 mg by mouth daily.        Marland Kitchen BIOTIN PO Take 1 tablet by mouth daily. For hair,skin,nails       . calcium-vitamin D 250-100 MG-UNIT per tablet Take 1 tablet by mouth daily. 500mg -calcium  -citrate with vit d3       . cholecalciferol (VITAMIN D) 1000 UNITS tablet Take 1,000 Units by mouth daily.        . fish oil-omega-3 fatty acids 1000 MG capsule Take 1,000 mg by mouth daily.        . hydrocortisone (ANUSOL-HC) 25 MG suppository Place 25 mg rectally 2 (two) times daily as needed. New med 1 supp pr bid x 5 days,2 refills       . lisinopril (PRINIVIL,ZESTRIL) 20 MG tablet Take 20 mg by mouth daily.        . Multiple Vitamins-Minerals (MULTIVITAMIN WITH MINERALS) tablet Take 1 tablet by mouth daily. Ca-iron-minerals daily vit for women       . rosuvastatin (CRESTOR) 5 MG tablet Take 5 mg by mouth daily.        . silver sulfADIAZINE (SILVADENE) 1 % cream Apply 1 application topically daily. Apply to affected area daily after rad tx and night, must remove cream before reapplying to area of skin          Physical Findings: The patient  is in no acute distress. Patient is alert and oriented.  weight is 171 lb 1.6 oz (77.61 kg). Her oral temperature is 97.6 F (36.4 C). Her blood pressure is 125/72 and her pulse is 68. Her respiration is 18. .  No no palpable cervical supraclavicular or axillary adenopathy. The lungs are clear to auscultation. The heart has regular rhythm and rate. The abdomen is soft and nontender with normal bowel sounds. The inguinal areas are free of adenopathy. on pelvic examination the patient has persistent recurrence in the anterior distal vagina. This however is overall smaller compared to pre-treatment volume. This area does bleed slightly with exam.  Lab Findings: Lab Results  Component Value Date   WBC 7.1 06/25/2007   HGB 11.2* 06/25/2007   HCT 33.2* 06/25/2007   MCV 91.1 06/25/2007   PLT 295 06/25/2007    @LASTCHEM @  Radiographic Findings: No results found.  Impression:  The patient is recovering from the effects of radiation.  She has stopped using her vaginal dilator dilator as this causes too much  discomfort for her and some bleeding.  Plan:  Chemotherapy  as above with Dr. Darrold Span. Patient will return for routine followup in radiation oncology in 3 months.  _____________________________________  -----------------------------------  [name], PhD, MD

## 2011-08-22 ENCOUNTER — Encounter: Payer: Self-pay | Admitting: *Deleted

## 2011-08-22 ENCOUNTER — Other Ambulatory Visit: Payer: Medicare Other

## 2011-08-25 ENCOUNTER — Ambulatory Visit (HOSPITAL_BASED_OUTPATIENT_CLINIC_OR_DEPARTMENT_OTHER): Payer: Medicare Other | Admitting: Oncology

## 2011-08-25 ENCOUNTER — Ambulatory Visit: Payer: Medicare Other

## 2011-08-25 ENCOUNTER — Other Ambulatory Visit: Payer: Self-pay

## 2011-08-25 ENCOUNTER — Ambulatory Visit (HOSPITAL_BASED_OUTPATIENT_CLINIC_OR_DEPARTMENT_OTHER): Payer: Medicare Other

## 2011-08-25 ENCOUNTER — Other Ambulatory Visit: Payer: Medicare Other | Admitting: Lab

## 2011-08-25 VITALS — BP 134/77 | HR 80 | Temp 97.0°F | Ht 62.0 in | Wt 172.1 lb

## 2011-08-25 DIAGNOSIS — C541 Malignant neoplasm of endometrium: Secondary | ICD-10-CM

## 2011-08-25 DIAGNOSIS — C549 Malignant neoplasm of corpus uteri, unspecified: Secondary | ICD-10-CM

## 2011-08-25 DIAGNOSIS — I10 Essential (primary) hypertension: Secondary | ICD-10-CM

## 2011-08-25 DIAGNOSIS — M899 Disorder of bone, unspecified: Secondary | ICD-10-CM

## 2011-08-25 DIAGNOSIS — C55 Malignant neoplasm of uterus, part unspecified: Secondary | ICD-10-CM

## 2011-08-25 LAB — COMPREHENSIVE METABOLIC PANEL
ALT: 17 U/L (ref 0–35)
Albumin: 3.9 g/dL (ref 3.5–5.2)
CO2: 26 mEq/L (ref 19–32)
Calcium: 9.1 mg/dL (ref 8.4–10.5)
Chloride: 103 mEq/L (ref 96–112)
Potassium: 4.1 mEq/L (ref 3.5–5.3)
Sodium: 139 mEq/L (ref 135–145)
Total Protein: 7.4 g/dL (ref 6.0–8.3)

## 2011-08-25 LAB — CBC WITH DIFFERENTIAL/PLATELET
Eosinophils Absolute: 0.2 10*3/uL (ref 0.0–0.5)
LYMPH%: 17 % (ref 14.0–49.7)
MONO#: 0.7 10*3/uL (ref 0.1–0.9)
NEUT#: 3.7 10*3/uL (ref 1.5–6.5)
Platelets: 197 10*3/uL (ref 145–400)
RBC: 3.87 10*6/uL (ref 3.70–5.45)
RDW: 12.6 % (ref 11.2–14.5)
WBC: 5.4 10*3/uL (ref 3.9–10.3)
lymph#: 0.9 10*3/uL (ref 0.9–3.3)
nRBC: 0 % (ref 0–0)

## 2011-08-25 MED ORDER — DEXAMETHASONE 4 MG PO TABS: ORAL_TABLET | ORAL | Status: DC

## 2011-08-25 MED ORDER — LORAZEPAM 0.5 MG PO TABS: ORAL_TABLET | ORAL | Status: DC

## 2011-08-25 MED ORDER — ONDANSETRON HCL 8 MG PO TABS: 8.0000 mg | ORAL_TABLET | Freq: Two times a day (BID) | ORAL | Status: AC | PRN

## 2011-08-25 NOTE — Patient Instructions (Signed)
First chemotherapy 8 AM on Tues 08-29-11.  Before the first chemotherapy, you will take 2 doses of steroids, which will be 12 hrs prior and 6 hrs prior. So:  Decadron (dexamethasone, steroid) 4 mg tablets --  Take 5 tablets (=20 mg) with food at 8 PM on Mon 08-28-11 and again at 2 AM on Tues 5-14 take 5 tablets (=20 mg ) with some food.  Night of chemotherapy, whether or not any nausea, take ativan (lorazepam) 0.5 mg at bedtime. This will make you drowsy. Morning after chemo, whether or not any nausea, take zofran (ondansetron) 8mg . This will not make you drowsy.  You can use additional doses of the nausea medicines according to directions on bottles if any nausea.   You can call Dr.Carlyle Mcelrath's office any time if questions or problems     [phone]

## 2011-08-26 ENCOUNTER — Encounter: Payer: Self-pay | Admitting: Oncology

## 2011-08-26 ENCOUNTER — Other Ambulatory Visit: Payer: Self-pay | Admitting: Oncology

## 2011-08-26 NOTE — Progress Notes (Signed)
[facility] NEW PATIENT CONSULTATION   Name: [name] Date:08/25/2011 MRN: [id number] DOB: [birth year]  REFERRING PHYSICIAN: [name], MD, gyn oncology with [facility]/ [facility]. Other physicians: J.Osborne, J.Kinard, K.Supple, M.Johnson  REASON FOR REFERRAL: consideration of chemotherapy for locally recurrent endometrial carcinoma.    HISTORY OF PRESENT ILLNESS:[name] is a 78 y.o. female, seen in consultation at request of Dr.Skinner, as patient lives in [place] and would like to receive chemotherapy close to home. She has had radiation therapy by Dr.Kinard at [facility] previously.  Patient had initially presented with postmenopausal bleeding to Dr.Diana Thomasena Edis and was referred to Dr.Elizabeth Skinner. She had stage 1 grade B2 endometrioid adenocarcinoma at surgery 02-18-2010, which was robotic hysterectomy/BSO/staging with bladder suspension. She had adjuvant brachytherapy by Dr.Kinard 3000cGy in 5 fractions. She did well until she was found to have a metastatic nodule in the anterior vaginal wall in fall 2012, with no evidence of other involvement. She was treated with external beam radiation therapy 02-20-11 thru 03-27-11 then intracavitary brachytherapy x4 thru 05-02-11 by Dr Roselind Messier. She had initial good shrinkage of the tumor nodule after radiation, then in April 2013 was found to have new ulcerations in distal vagina which were biopsy proven to be progressive disease. Repeat CT AP had no evidence of other metastatic disease, tho reportedly had some changes consistent with radiation therapy. She saw Dr.Skinner for discussion of treatment options on 08-10-11. As surgery would likely require anterior exoneration with urinary diversion, recommendation was for systemic chemotherapy using carboplatin with weekly taxol. I discussed care with Dr.Skinner and with her PA by phone prior to this visit;  Dr Clifton James will continue to follow for gyn oncology, to see patient for re-evaluation probably after 3 cycles of chemotherapy. Patient also saw Dr.Kinard for scheduled follow up earlier this week and attended chemotherapy education class prior to this visit. As our education RN and patient both tell me that she was "overwhelmed" with the information, my RN has repeated teaching for the carboplatin and taxol today. Patient is followed for primary care by Dr.Jim Earl Gala, whom she sees ~ twice yearly, had several surgeries by Dr Rennis Chris in 2009 and has had colonoscopy by Dr.Martin Laural Benes.  REVIEW OF SYSTEMS:She is at usual weight. No HA or neurologic symptoms. Good visual acuity with contacts. Little environmental allergies/sinus. No known dental problems, followed regularly by periodontist. No thyroid problems. No respiratory symptoms. No cardiac symptoms. No noted changes on breast self-exam, with mammograms done at [facility]. No nausea or vomiting now tho she had some nausea with RT, no GERD. Bowels still looser than baseline since RT. Some delayed bladder emptying also since RT, no dysuria or hematuria. No fever or symptoms of infection. Some arthritis hands,knees, ankles which does not require medication. No neuropathy symptoms.No bleeding other than slight vaginal spotting which requires only panty liner. No swelling LE. No history blood clots. Energy good.  ALLERGIES: Voltaren causes rash  PAST MEDICAL HISTORY: G3P3 (2 daughters, 1 son), HTN x ~ 10 yrs, osteopenia, parathyroidectomy for parathyroid adenoma, nephrolithiasis, breast reduction bilateral, normal colonoscopy ~ 2005. Last mammograms in EMR Aug 2011. Used prempro x 20 yrs.Strep fasciitis/ myositis right thigh 2009 during which patient was acutely ill "in coma x 1 wk", requiring 3 debridements by Dr.Supple and prolonged antibiotics by Dr Cliffton Asters; no residual symptoms from this.     CURRENT MEDICATIONS: medications reviewed as listed  .   SOCIAL HISTORY: originally from [place],  in [place] x 53 years. Lives with husband who is 20 and in good health, retired Psychologist, occupational. 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 No tobacco, occasional Etoh, does not know if she was transfused in 2009 but not otherwise. Children live in [place] and [place], one grandson age 31 in [place]. Patient enjoys yard work and walks and does water exercise classes. She is friends with S.Poindexter, whom I know.  FAMILY HISTORY: no known cancer  LABORATORY DATA:   CBC today WBC 5.4, ANC 3.7, Hgb 12.9, plt 197k, normal differential  CMET today normal with exception of Tbili 0.2, including creat 0.98, normal electrolytes, total protein 7.4 and alb 3.9 RADIOGRAPHY: No results found.         PHYSICAL EXAM:  height is 5\' 2"  (1.575 m) and weight is 172 lb 1.6 oz (78.064 kg). Her oral temperature is 97 F (36.1 C). Her blood pressure is 134/77 and her pulse is 80.  Very pleasant lady looks younger that stated age, good historian, seems to follow conversation well. Moves slowly after seated for a time, but does not need assistance. HEENT: normal hair pattern, PERRL, not icteric, oral mucosa moist and clear, no apparent dental problems, neck supple without thyroid mass. Lymphatics: no adenopathy cervical, supraclavicular, axillary, inguinal. Lungs clear to ascultation and percussion. Breasts bilaterally with well-healed surgical scars, no dominant masses and no skin or nipple findings of concern. Axillae and UE not remarkable. Cardiac:RRR without murmur or gallop. Abdomen soft and nontender, normal bowel sounds, not distended, no appreciable organomegaly or mass. No inguinal adenopathy. Surgical scars well-healed. Pelvic exam done by Dr.Kinard 08-21-11 and not repeated. LE no edema, cords, tenderness. Neuro: CN, motor, sensory grossly nonfocal. Skin without rash or bruises.    We have discussed all of history as above and rationale for  chemotherapy, as well as mechanism of action of chemotherapy, choice of taxol and carboplatin, possible side effects, peripheral IV administration vs central line, outpatient treatment and follow up. As noted above, my RN has reviewed teaching also. I have given her written and oral instructions for premedication decadron, which she will take 12 hrs and 6 hrs before first taxol, then only 12 hr prior dose from there if no problems with taxol. We have discussed steroid side effects. I have given her written and oral instructions also for ativan and zofran antiemetics.  Dr.Skinner has recommended taxol at 80 mg/m2 weekly x3 with carboplain at AUC=5 on day 1, q 28 days. Patient does appear to have good peripheral venous access.  Patient had all questions answered to her satisfaction, was in agreement with plan and expressed that she felt much more comfortable after visit today. She understands that she can call at any time if questions or concerns.  IMPRESSION/ PLAN:  1.Endometroid adenocarcinoma of uterus with history as above, locally progressive in vagina following radiation. Will proceed with chemotherapy as above, day 1 cycle 1 on 08-29-11, using today's labs for that treatment. I will ask pharmacist to review calculated carboplatin dose due to her age. I will see her back at least on 09-04-11 or sooner if needed. 2. HTN, osteopenia, hx nephrolithiasis, hx strep fasciitis as above.  Thank you for asking me to assist in the care of this delightful lady.  Julien Berryman P, MD 08/26/2011 2:27 PM

## 2011-08-26 NOTE — Progress Notes (Signed)
New Patient Consultation note from 08-25-2011 routed to Drs Clifton James, Einar Grad.

## 2011-08-29 ENCOUNTER — Ambulatory Visit (HOSPITAL_BASED_OUTPATIENT_CLINIC_OR_DEPARTMENT_OTHER): Payer: Medicare Other

## 2011-08-29 VITALS — BP 129/77 | HR 64 | Temp 97.4°F

## 2011-08-29 DIAGNOSIS — C541 Malignant neoplasm of endometrium: Secondary | ICD-10-CM

## 2011-08-29 DIAGNOSIS — Z5111 Encounter for antineoplastic chemotherapy: Secondary | ICD-10-CM

## 2011-08-29 DIAGNOSIS — C549 Malignant neoplasm of corpus uteri, unspecified: Secondary | ICD-10-CM

## 2011-08-29 DIAGNOSIS — C7982 Secondary malignant neoplasm of genital organs: Secondary | ICD-10-CM

## 2011-08-29 MED ORDER — SODIUM CHLORIDE 0.9 % IV SOLN
Freq: Once | INTRAVENOUS | Status: AC
  Administered 2011-08-29: 09:00:00 via INTRAVENOUS

## 2011-08-29 MED ORDER — ONDANSETRON 16 MG/50ML IVPB (CHCC)
16.0000 mg | Freq: Once | INTRAVENOUS | Status: AC
  Administered 2011-08-29: 16 mg via INTRAVENOUS

## 2011-08-29 MED ORDER — PACLITAXEL CHEMO INJECTION 300 MG/50ML
80.0000 mg/m2 | Freq: Once | INTRAVENOUS | Status: AC
  Administered 2011-08-29: 150 mg via INTRAVENOUS
  Filled 2011-08-29: qty 25

## 2011-08-29 MED ORDER — FAMOTIDINE IN NACL 20-0.9 MG/50ML-% IV SOLN
20.0000 mg | Freq: Once | INTRAVENOUS | Status: AC
  Administered 2011-08-29: 20 mg via INTRAVENOUS

## 2011-08-29 MED ORDER — CARBOPLATIN CHEMO INJECTION 600 MG/60ML
400.0000 mg | Freq: Once | INTRAVENOUS | Status: AC
  Administered 2011-08-29: 400 mg via INTRAVENOUS
  Filled 2011-08-29: qty 40

## 2011-08-29 MED ORDER — DEXAMETHASONE SODIUM PHOSPHATE 4 MG/ML IJ SOLN
20.0000 mg | Freq: Once | INTRAMUSCULAR | Status: AC
  Administered 2011-08-29: 20 mg via INTRAVENOUS

## 2011-08-29 MED ORDER — DIPHENHYDRAMINE HCL 50 MG/ML IJ SOLN
25.0000 mg | Freq: Once | INTRAMUSCULAR | Status: AC
  Administered 2011-08-29: 25 mg via INTRAVENOUS

## 2011-08-29 NOTE — Patient Instructions (Signed)
[facility] Discharge Instructions for Patients Receiving Chemotherapy  Today you received the following chemotherapy agents taxol/ carboplatin  To help prevent nausea and vomiting after your treatment, we encourage you to take your nausea medication  and take it as often as prescribed   If you develop nausea and vomiting that is not controlled by your nausea medication, call the clinic. If it is after clinic hours your family physician or the after hours number for the clinic or go to the Emergency Department.   BELOW ARE SYMPTOMS THAT SHOULD BE REPORTED IMMEDIATELY:  *FEVER GREATER THAN 100.5 F  *CHILLS WITH OR WITHOUT FEVER  NAUSEA AND VOMITING THAT IS NOT CONTROLLED WITH YOUR NAUSEA MEDICATION  *UNUSUAL SHORTNESS OF BREATH  *UNUSUAL BRUISING OR BLEEDING  TENDERNESS IN MOUTH AND THROAT WITH OR WITHOUT PRESENCE OF ULCERS  *URINARY PROBLEMS  *BOWEL PROBLEMS  UNUSUAL RASH Items with * indicate a potential emergency and should be followed up as soon as possible.  One of the nurses will contact you 24 hours after your treatment. Please let the nurse know about any problems that you may have experienced. Feel free to call the clinic you have any questions or concerns. The clinic phone number is [phone].   I have been informed and understand all the instructions given to me. I know to contact the clinic, my physician, or go to the Emergency Department if any problems should occur. I do not have any questions at this time, but understand that I may call the clinic during office hours   should I have any questions or need assistance in obtaining follow up care.    __________________________________________  _____________  __________ Signature of Patient or Authorized Representative            Date                   Time    __________________________________________ Nurse's Signature    

## 2011-09-03 ENCOUNTER — Other Ambulatory Visit: Payer: Self-pay | Admitting: Oncology

## 2011-09-04 ENCOUNTER — Other Ambulatory Visit (HOSPITAL_BASED_OUTPATIENT_CLINIC_OR_DEPARTMENT_OTHER): Payer: Medicare Other | Admitting: Lab

## 2011-09-04 ENCOUNTER — Other Ambulatory Visit: Payer: Self-pay

## 2011-09-04 ENCOUNTER — Ambulatory Visit (HOSPITAL_COMMUNITY)
Admission: RE | Admit: 2011-09-04 | Discharge: 2011-09-04 | Disposition: A | Discharge status: Home / Self Care | Payer: Medicare Other | Source: Ambulatory Visit | Attending: Oncology | Admitting: Oncology

## 2011-09-04 ENCOUNTER — Ambulatory Visit (HOSPITAL_BASED_OUTPATIENT_CLINIC_OR_DEPARTMENT_OTHER): Payer: Medicare Other | Admitting: Oncology

## 2011-09-04 VITALS — BP 100/58 | HR 84 | Temp 97.6°F | Ht 62.0 in | Wt 165.7 lb

## 2011-09-04 DIAGNOSIS — M810 Age-related osteoporosis without current pathological fracture: Secondary | ICD-10-CM

## 2011-09-04 DIAGNOSIS — C541 Malignant neoplasm of endometrium: Secondary | ICD-10-CM

## 2011-09-04 DIAGNOSIS — Z8542 Personal history of malignant neoplasm of other parts of uterus: Secondary | ICD-10-CM | POA: Insufficient documentation

## 2011-09-04 DIAGNOSIS — R42 Dizziness and giddiness: Secondary | ICD-10-CM | POA: Insufficient documentation

## 2011-09-04 DIAGNOSIS — C549 Malignant neoplasm of corpus uteri, unspecified: Secondary | ICD-10-CM

## 2011-09-04 DIAGNOSIS — G9389 Other specified disorders of brain: Secondary | ICD-10-CM | POA: Insufficient documentation

## 2011-09-04 DIAGNOSIS — I10 Essential (primary) hypertension: Secondary | ICD-10-CM

## 2011-09-04 LAB — CBC WITH DIFFERENTIAL/PLATELET
BASO%: 0.4 % (ref 0.0–2.0)
EOS%: 2.7 % (ref 0.0–7.0)
MCH: 33.5 pg (ref 25.1–34.0)
MCHC: 34.1 g/dL (ref 31.5–36.0)
MONO#: 0.2 10*3/uL (ref 0.1–0.9)
RBC: 3.89 10*6/uL (ref 3.70–5.45)
WBC: 4.1 10*3/uL (ref 3.9–10.3)
lymph#: 0.8 10*3/uL — ABNORMAL LOW (ref 0.9–3.3)
nRBC: 0 % (ref 0–0)

## 2011-09-04 LAB — COMPREHENSIVE METABOLIC PANEL
ALT: 13 U/L (ref 0–35)
AST: 16 U/L (ref 0–37)
Alkaline Phosphatase: 55 U/L (ref 39–117)
Calcium: 9.6 mg/dL (ref 8.4–10.5)
Chloride: 103 mEq/L (ref 96–112)
Creatinine, Ser: 1.03 mg/dL (ref 0.50–1.10)
Potassium: 4.5 mEq/L (ref 3.5–5.3)

## 2011-09-04 MED ORDER — DEXAMETHASONE 4 MG PO TABS: ORAL_TABLET | ORAL | Status: DC

## 2011-09-04 NOTE — Patient Instructions (Signed)
Miralax (glycolax) powder: one capful mixed in water or other liquid once or twice daily to keep bowels moving well daily.  CT scan of head this afternoon at [facility]. Check in at radiology department at 4:45 for 5 pm scan today.  Please call Dr.Jakob Kimberlin's nurse tomorrow AM (Tues 5-21) to let us know how the dizziness is. No chemotherapy tomorrow; we will reschedule the treatment depending on how you are feeling.

## 2011-09-05 ENCOUNTER — Encounter: Payer: Self-pay | Admitting: Oncology

## 2011-09-05 ENCOUNTER — Telehealth: Payer: Self-pay

## 2011-09-05 ENCOUNTER — Ambulatory Visit: Payer: Medicare Other

## 2011-09-05 ENCOUNTER — Telehealth: Payer: Self-pay | Admitting: Oncology

## 2011-09-05 ENCOUNTER — Other Ambulatory Visit: Payer: Self-pay | Admitting: Oncology

## 2011-09-05 NOTE — Telephone Encounter (Signed)
Called pt to inform her per Dr. Darrold Span, she has spoken with her PCP, Dr. Earl Gala to inform him of what is going on with pt, and he would like to see pt this afternoon to try and determine any other reasons for pt's symptoms, and his office will contact pt with appt.  Pt verbalizes understanding and is aware of next appt here.

## 2011-09-05 NOTE — Progress Notes (Signed)
Spoke with Dr Earl Gala after he evaluated patient today: nothing clear on his exam either and he does not think she needs neurology appointment now. He will hold lisinopril as BP may be a little low and has encouraged her to use a cane. He requests that we watch BP off the lisinopril. His assessment today much appreciated.

## 2011-09-05 NOTE — Progress Notes (Signed)
OFFICE PROGRESS NOTE Date of Visit 09-04-2011 Physicians: E.Skinner, J.Osborne, J.Kinard, K.Supple, M.Johnson  INTERVAL HISTORY:  Patient is seen, alone for visit, in continuing attention to her endometrial carcinoma which has progressed locally in vagina despite radiation, now on chemotherapy. Patient had day 1 cycle 1 cytoxan/ taxol on 08-29-11, with day 8 and day 15 taxol planned 5-21 and 5-29. Patient had initially presented with postmenopausal bleeding to Dr.Diana Thomasena Edis and was referred to Dr.Elizabeth Skinner. She had stage 1 grade B2 endometrioid adenocarcinoma at surgery 02-18-2010, which was robotic hysterectomy/BSO/staging with bladder suspension. She had adjuvant brachytherapy by Dr.Kinard 3000cGy in 5 fractions. She did well until she was found to have a metastatic nodule in the anterior vaginal wall in fall 2012, with no evidence of other involvement. She was treated with external beam radiation therapy 02-20-11 thru 03-27-11 then intracavitary brachytherapy x4 thru 05-02-11 by Dr Roselind Messier. She had initial good shrinkage of the tumor nodule after radiation, then in April 2013 was found to have new ulcerations in distal vagina which were biopsy proven to be progressive disease. Repeat CT AP had no evidence of other metastatic disease, tho reportedly had some changes consistent with radiation therapy. She saw Dr.Skinner for discussion of treatment options on 08-10-11. As surgery would likely require anterior exoneration with urinary diversion, recommendation was for systemic chemotherapy using carboplatin with weekly taxol; Dr Clifton James will continue to follow for gyn oncology.  Patient did generally will during treatment on 5-14 and on 5-16, with mild nausea controlled with antiemetics and able to drink fluids well tho has had taste changes. On 5-17 she "felt dizzy", which she describes as "head does not feel right" without frank vertigo, no HA, not orthostatic or posititonal. The dizziness has been  continuous, possibly just a little better today. She also had abdominal cramps associated with some constipation, with small hard stools; she is not using any stool softner or laxative and I have asked her to begin miralax 1-2x daily. She has no peripheral neuropathy symptoms, no fever, no shortness of breath, no chest pain or clear cardiac symptoms, did sleep last pm, no bladder symptoms, no LE swelling. Remainder of 10 point Review of Systems negative.  Objective:  Vital signs in last 24 hours:  BP 100/58  Pulse 84  Temp(Src) 97.6 F (36.4 C) (Oral)  Ht 5\' 2"  (1.575 m)  Wt 165 lb 11.2 oz (75.161 kg)  BMI 30.31 kg/m2 Orthostatic vitals by RN show no changes: lying 104/58 with HR 80, sitting 100/58 with HR 84 and standing 100/58 with HR 84. Ambulatory slowly with minimal assistance getting up from chair and on/off exam table. Speech fluent and appropriate. Seems to follow conversation well.  HEENT:mucous membranes moist, pharynx normal without lesions. PERRL. Neck supple without JVD LymphaticsCervical, supraclavicular, and axillary nodes normal.No inguinal adenopathy Resp: clear to auscultation bilaterally and normal percussion bilaterally Cardio: regular rate and rhythm, no gallop, clear heart sounds GI: soft, few bowel sounds, not distended, not tender, no appreciable HSM or masses. Extremities: extremities normal, atraumatic, no cyanosis or edema Neuro:no sensory deficits noted. CN intact. Motor strength symmetrical. Somewhat wide-based gait.   Lab Results:   [facility] 09/04/11 1418  WBC 4.1  HGB 13.0  HCT 38.2  PLT 199  ANC 2.9  BMET  Basename 09/04/11 1418  NA 137  K 4.5  CL 103  CO2 26  GLUCOSE 131*  BUN 23  CREATININE 1.03  CALCIUM 9.6  Remainder of full CMET WNL  Studies/Results: CT head obtained after  visit, as follows: Ct Head Wo Contrast  09/04/2011  *RADIOLOGY REPORT*  Clinical Data: Dizziness.  Rule out CVA.  History of endometrial carcinoma in 2011.  CT  HEAD WITHOUT CONTRAST  Technique:  Contiguous axial images were obtained from the base of the skull through the vertex without contrast.  Comparison: None.  Findings: Bone windows demonstrate clear paranasal sinuses and mastoid air cells.  Soft tissue windows demonstrate expected cerebral atrophy for age. Physiologic calcifications within the left basal ganglia. Partially calcified lesion along the periphery of the left frontal lobe measures 8 mm on image 26.  No surrounding edema or mass effect.  IMPRESSION:  1.  No evidence of CVA or acute process. 2.  8 mm calcified lesion along the periphery of the left frontal lobe is most consistent with a meningioma.  No significant mass effect or surrounding edema.  Original Report Authenticated By: Consuello Bossier, M.D.    Medications: I have reviewed the patient's current medications.  Etiology of dizziness complaint not clear; I would not expect this from the chemotherapy medications and she does not appear dehydrated. CT ordered at visit. Will hold chemotherapy that had been planned for 09-05-11 as I do not want to complicate situation with steroids/ taxol tonight and tomorrow.   Assessment/Plan: 1. Locally recurrent endometrial carcinoma: day 1 cycle 1 carbo/taxol given 08-29-11, now day 8 held as above. We will follow up with patient at least by phone tomorrow; she is already scheduled to see me 09-12-11 piror to day 15 cycle 1 taxol on 5-29. 2.Dizziness or unsteadiness: evaluation with CT pending at completion of visit 3. HTN, osteoporosis, nephrolithiasis, hx strep fasciitis   Millette Halberstam P, MD   09/05/2011, 10:26 AM

## 2011-09-05 NOTE — Telephone Encounter (Signed)
Called pt to inform her per Dr. Darrold Span, the head CT done yesterday did not show any reason for the unsteadiness, specifically no little stroke, and her sinuses look fine.  Also asked pt how is the dizziness today.  Pt states it is not much better today.  Pt states she did get up out of bed this morning and had some dizziness, got back in bed and has been resting, and it did go away some, and she is fine right now, but she has not been moving around a lot.  Pt states she has no other new symptoms.  Informed pt will make MD aware, and office will call her back.  She verbalizes understanding.

## 2011-09-05 NOTE — Telephone Encounter (Signed)
Spoke with patient's primary MD Dr Earl Gala as dizziness continues, head CT negative. He is glad to see her this afternoon; his office to contact her. RN will let patient know to expect call from Dr Newell Coral office

## 2011-09-11 ENCOUNTER — Other Ambulatory Visit: Payer: Self-pay | Admitting: Oncology

## 2011-09-12 ENCOUNTER — Telehealth: Payer: Self-pay | Admitting: Oncology

## 2011-09-12 ENCOUNTER — Ambulatory Visit (HOSPITAL_BASED_OUTPATIENT_CLINIC_OR_DEPARTMENT_OTHER): Payer: Medicare Other | Admitting: Oncology

## 2011-09-12 ENCOUNTER — Other Ambulatory Visit (HOSPITAL_BASED_OUTPATIENT_CLINIC_OR_DEPARTMENT_OTHER): Payer: Medicare Other | Admitting: Lab

## 2011-09-12 ENCOUNTER — Telehealth: Payer: Self-pay | Admitting: *Deleted

## 2011-09-12 ENCOUNTER — Encounter: Payer: Self-pay | Admitting: Oncology

## 2011-09-12 ENCOUNTER — Other Ambulatory Visit: Payer: Self-pay

## 2011-09-12 VITALS — BP 137/70 | HR 65 | Temp 97.1°F | Ht 62.0 in | Wt 170.1 lb

## 2011-09-12 DIAGNOSIS — C541 Malignant neoplasm of endometrium: Secondary | ICD-10-CM

## 2011-09-12 DIAGNOSIS — M810 Age-related osteoporosis without current pathological fracture: Secondary | ICD-10-CM

## 2011-09-12 DIAGNOSIS — C549 Malignant neoplasm of corpus uteri, unspecified: Secondary | ICD-10-CM

## 2011-09-12 DIAGNOSIS — I10 Essential (primary) hypertension: Secondary | ICD-10-CM

## 2011-09-12 DIAGNOSIS — C7982 Secondary malignant neoplasm of genital organs: Secondary | ICD-10-CM

## 2011-09-12 LAB — CBC WITH DIFFERENTIAL/PLATELET
BASO%: 1.4 % (ref 0.0–2.0)
EOS%: 3.3 % (ref 0.0–7.0)
HCT: 34.9 % (ref 34.8–46.6)
MCH: 33.6 pg (ref 25.1–34.0)
MCHC: 34.1 g/dL (ref 31.5–36.0)
MONO#: 0.6 10*3/uL (ref 0.1–0.9)
RBC: 3.55 10*6/uL — ABNORMAL LOW (ref 3.70–5.45)
RDW: 12.4 % (ref 11.2–14.5)
WBC: 3 10*3/uL — ABNORMAL LOW (ref 3.9–10.3)
lymph#: 0.8 10*3/uL — ABNORMAL LOW (ref 0.9–3.3)
nRBC: 0 % (ref 0–0)

## 2011-09-12 MED ORDER — DEXAMETHASONE 4 MG PO TABS: ORAL_TABLET | ORAL | Status: DC

## 2011-09-12 NOTE — Progress Notes (Signed)
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  Day 1 cycle 1 was given on 08-29-11, then day 8 was held on 5-21 due to new onset of "dizziness". CT head done 09-04-11 showed no acute process including CVA. She was seen by PCP Dr Earl Gala on 5-21, with no clear etiology for the symptoms other than somewhat low BP. He held her lisinopril, which she had been on x years, and encouraged her to use cane. Patient tells me that by afternoon of the first day off lisinopril those symptoms had resolved entirely and that she has felt very well since then. She does not feel that she needs a cane now. She is due day 15 cycle 1 on 09-13-2011.  Patient had initially presented with postmenopausal bleeding to Dr.Diana Thomasena Edis and was referred to Dr.Elizabeth Skinner. She had stage 1 grade B2 endometrioid adenocarcinoma at surgery 02-18-2010, which was robotic hysterectomy/BSO/staging with bladder suspension. She had adjuvant brachytherapy by Dr.Kinard 3000cGy in 5 fractions. She did well until she was found to have a metastatic nodule in the anterior vaginal wall in fall 2012, with no evidence of other involvement. She was treated with external beam radiation therapy 02-20-11 thru 03-27-11 then intracavitary brachytherapy x4 thru 05-02-11 by Dr Roselind Messier. She had initial good shrinkage of the tumor nodule after radiation, then in April 2013 was found to have new ulcerations in distal vagina which were biopsy proven to be progressive disease. Repeat CT AP had no evidence of other metastatic disease, tho reportedly had some changes consistent with radiation therapy. She saw Dr.Skinner for discussion of treatment options on 08-10-11. As surgery would likely require  anterior exoneration with urinary diversion, recommendation was for systemic chemotherapy using carboplatin with weekly taxol; Dr Clifton James will continue to follow also.  Patient continues with small vaginal spotting, unchanged, which requires pantyliners only. She is having no other bleeding, no nausea or vomiting, bowels are moving, no significant peripheral neuropathy symptoms, no fever or symptoms of infection, no other neurologic symptoms. Remainder of 10 point Review of Systems negative/ unchanged.  Objective:  Vital signs in last 24 hours:  BP 137/70  Pulse 65  Temp(Src) 97.1 F (36.2 C) (Oral)  Ht 5\' 2"  (1.575 m)  Wt 170 lb 1.6 oz (77.157 kg)  BMI 31.11 kg/m2 Weight is up 5 lbs. Ambulatory with some slight difficulty as is baseline, not using cane. Able to get on and off exam table with minimal assistance.  HEENT:mucous membranes moist, pharynx normal without lesions PERRL. No alopecia. LymphaticsCervical, supraclavicular, and axillary nodes normal.No inguinal adenopathy Resp: clear to auscultation bilaterally and normal percussion bilaterally Cardio: regular rate and rhythm GI: soft, non-tender; bowel sounds normal; no masses,  no organomegaly Extremities: extremities normal, atraumatic, no cyanosis or edema Neuro:no peripheral neuropathy    Lab Results:   Basename 09/12/11 0811  WBC 3.0*  HGB 11.9  HCT 34.9  PLT 172  ANC 1.5  BMET Last CMET 09-04-11 Studies/Results:  No results found.  Medications: I have reviewed the patient's current medications.  Assessment/Plan: 1.Endometrial carcinoma recurrent in vagina following radiation, chemotherapy in process. She had day 1 cycle 1 carbo AUC=5 and taxol 80 mg/m2 on 08-29-11, then day 8 taxol held due to symptoms of dizziness, now for  day 15 on 09-13-11. We will not make up day 8 this cycle. I will see her back at least on 09-22-11 with cbc/cmet and she will be due cycle 2 6-11, 6-18 and 10-10-11. I have also scheduled visit  on 10-02-11 with cbc.  2.Complaints of dizziness resolved with DC lisinopril Will follow blood pressure at this office as discussed with Dr Earl Gala 3.Hx HTN, osteoporosis, nephrolithiasis, past strep fasciitis.  Patient was comfortable with discussion and plan as above. She understands premedication decadron for taxol.   Marylouise Mallet P, MD   09/12/2011, 11:43 AM

## 2011-09-12 NOTE — Telephone Encounter (Signed)
Prescription for decadron 09-12-11 is for 4 treatments.

## 2011-09-12 NOTE — Telephone Encounter (Signed)
Per staff message from Anne, I have scheduled treatment appts. JMW  

## 2011-09-12 NOTE — Telephone Encounter (Signed)
appts  made and printed for pt,pt aware that i will call with tx appts   aom 

## 2011-09-12 NOTE — Patient Instructions (Signed)
Take decadron ( dexamethasone, 4 mg tablets) five tablets = 20 mg with food ~ 12 hrs before taxol.  Taxol will be 1:45 pm on 5-29, so take five tablets at ~ 1;30 am on 5-29.

## 2011-09-13 ENCOUNTER — Encounter: Payer: Self-pay | Admitting: Dietician

## 2011-09-13 ENCOUNTER — Ambulatory Visit (HOSPITAL_BASED_OUTPATIENT_CLINIC_OR_DEPARTMENT_OTHER): Payer: Medicare Other

## 2011-09-13 VITALS — BP 157/80 | HR 80 | Temp 97.5°F

## 2011-09-13 DIAGNOSIS — Z5111 Encounter for antineoplastic chemotherapy: Secondary | ICD-10-CM

## 2011-09-13 DIAGNOSIS — C549 Malignant neoplasm of corpus uteri, unspecified: Secondary | ICD-10-CM

## 2011-09-13 DIAGNOSIS — C541 Malignant neoplasm of endometrium: Secondary | ICD-10-CM

## 2011-09-13 MED ORDER — DIPHENHYDRAMINE HCL 50 MG/ML IJ SOLN
25.0000 mg | Freq: Once | INTRAMUSCULAR | Status: AC
  Administered 2011-09-13: 25 mg via INTRAVENOUS

## 2011-09-13 MED ORDER — ONDANSETRON 16 MG/50ML IVPB (CHCC)
16.0000 mg | Freq: Once | INTRAVENOUS | Status: AC
  Administered 2011-09-13: 16 mg via INTRAVENOUS

## 2011-09-13 MED ORDER — PACLITAXEL CHEMO INJECTION 300 MG/50ML
80.0000 mg/m2 | Freq: Once | INTRAVENOUS | Status: AC
  Administered 2011-09-13: 144 mg via INTRAVENOUS
  Filled 2011-09-13: qty 24

## 2011-09-13 MED ORDER — SODIUM CHLORIDE 0.9 % IV SOLN
Freq: Once | INTRAVENOUS | Status: AC
  Administered 2011-09-13: 14:00:00 via INTRAVENOUS

## 2011-09-13 MED ORDER — FAMOTIDINE IN NACL 20-0.9 MG/50ML-% IV SOLN
20.0000 mg | Freq: Once | INTRAVENOUS | Status: AC
  Administered 2011-09-13: 20 mg via INTRAVENOUS

## 2011-09-13 MED ORDER — DEXAMETHASONE SODIUM PHOSPHATE 4 MG/ML IJ SOLN
20.0000 mg | Freq: Once | INTRAMUSCULAR | Status: AC
  Administered 2011-09-13: 20 mg via INTRAVENOUS

## 2011-09-13 NOTE — Patient Instructions (Signed)
[facility] Discharge Instructions for Patients Receiving Chemotherapy  Today you received the following chemotherapy agents Taxol  To help prevent nausea and vomiting after your treatment, we encourage you to take your nausea medication as prescribed.  If you develop nausea and vomiting that is not controlled by your nausea medication, call the clinic. If it is after clinic hours your family physician or the after hours number for the clinic or go to the Emergency Department.   BELOW ARE SYMPTOMS THAT SHOULD BE REPORTED IMMEDIATELY:  *FEVER GREATER THAN 100.5 F  *CHILLS WITH OR WITHOUT FEVER  NAUSEA AND VOMITING THAT IS NOT CONTROLLED WITH YOUR NAUSEA MEDICATION  *UNUSUAL SHORTNESS OF BREATH  *UNUSUAL BRUISING OR BLEEDING  TENDERNESS IN MOUTH AND THROAT WITH OR WITHOUT PRESENCE OF ULCERS  *URINARY PROBLEMS  *BOWEL PROBLEMS  UNUSUAL RASH Items with * indicate a potential emergency and should be followed up as soon as possible.  One of the nurses will contact you 24 hours after your treatment. Please let the nurse know about any problems that you may have experienced. Feel free to call the clinic you have any questions or concerns. The clinic phone number is [phone].   I have been informed and understand all the instructions given to me. I know to contact the clinic, my physician, or go to the Emergency Department if any problems should occur. I do not have any questions at this time, but understand that I may call the clinic during office hours   should I have any questions or need assistance in obtaining follow up care.    __________________________________________  _____________  __________ Signature of Patient or Authorized Representative            Date                   Time    __________________________________________ Nurse's Signature    

## 2011-09-13 NOTE — Progress Notes (Signed)
Brief Out-patient Oncology Nutrition Note  Reason: Positive Nutrition Risk Screen for Unintentional weight loss and decreased appetite.  [name] is an 78 year old female patient of Dr. Darrold Span, diagnosed with endometrial cancer. Attempted to contact [name] via home telephone number for nutrition risk. Left patient voice mail with RD contact information.   Wt Readings from Last 10 Encounters:  09/12/11 170 lb 1.6 oz (77.157 kg)  09/04/11 165 lb 11.2 oz (75.161 kg)  08/25/11 172 lb 1.6 oz (78.064 kg)  08/21/11 171 lb 1.6 oz (77.61 kg)  05/29/11 169 lb 9.6 oz (76.93 kg)  04/17/11 169 lb 9.6 oz (76.93 kg)  04/04/11 165 lb 9.6 oz (75.116 kg)  03/27/11 165 lb 9.6 oz (75.116 kg)  03/22/11 165 lb 8 oz (75.07 kg)  03/20/11 167 lb 3.2 oz (75.841 kg)     RD available for nutrition needs.   Iven Finn [facility] [phone]

## 2011-09-15 ENCOUNTER — Telehealth: Payer: Self-pay | Admitting: Oncology

## 2011-09-15 NOTE — Telephone Encounter (Signed)
called pt with all tx and other   appts

## 2011-09-22 ENCOUNTER — Other Ambulatory Visit (HOSPITAL_BASED_OUTPATIENT_CLINIC_OR_DEPARTMENT_OTHER): Payer: Medicare Other | Admitting: Lab

## 2011-09-22 ENCOUNTER — Ambulatory Visit (HOSPITAL_BASED_OUTPATIENT_CLINIC_OR_DEPARTMENT_OTHER): Payer: Medicare Other | Admitting: Oncology

## 2011-09-22 ENCOUNTER — Encounter: Payer: Self-pay | Admitting: Oncology

## 2011-09-22 ENCOUNTER — Telehealth: Payer: Self-pay | Admitting: Oncology

## 2011-09-22 VITALS — BP 152/80 | HR 79 | Temp 98.2°F | Resp 18 | Ht 62.0 in | Wt 167.7 lb

## 2011-09-22 DIAGNOSIS — C7982 Secondary malignant neoplasm of genital organs: Secondary | ICD-10-CM

## 2011-09-22 DIAGNOSIS — C541 Malignant neoplasm of endometrium: Secondary | ICD-10-CM

## 2011-09-22 DIAGNOSIS — C549 Malignant neoplasm of corpus uteri, unspecified: Secondary | ICD-10-CM

## 2011-09-22 LAB — COMPREHENSIVE METABOLIC PANEL
AST: 17 U/L (ref 0–37)
Alkaline Phosphatase: 59 U/L (ref 39–117)
BUN: 12 mg/dL (ref 6–23)
Creatinine, Ser: 0.92 mg/dL (ref 0.50–1.10)
Glucose, Bld: 101 mg/dL — ABNORMAL HIGH (ref 70–99)
Total Bilirubin: 0.3 mg/dL (ref 0.3–1.2)

## 2011-09-22 LAB — CBC WITH DIFFERENTIAL/PLATELET
Basophils Absolute: 0 10*3/uL (ref 0.0–0.1)
EOS%: 2.2 % (ref 0.0–7.0)
Eosinophils Absolute: 0.1 10*3/uL (ref 0.0–0.5)
HGB: 12.3 g/dL (ref 11.6–15.9)
LYMPH%: 20.8 % (ref 14.0–49.7)
MCH: 33.6 pg (ref 25.1–34.0)
MCV: 97.4 fL (ref 79.5–101.0)
MONO%: 9 % (ref 0.0–14.0)
NEUT#: 2.8 10*3/uL (ref 1.5–6.5)
Platelets: 167 10*3/uL (ref 145–400)

## 2011-09-22 NOTE — Progress Notes (Signed)
OFFICE PROGRESS NOTE Date of VIsit 09-22-11  Physicians: J.Osborne, E.Skinner, J.Kinard, K.Supple,M.Johnson  INTERVAL HISTORY:  Patient is seen, alone for visit, in continuing attention to her recurrent endometrial carcinoma, this involving vagina and under treatment with carboplatin and taxol.   Patient had initially presented with postmenopausal bleeding to Dr.Diana Thomasena Edis and was referred to Dr.Elizabeth Skinner. She had stage 1 grade B2 endometrioid adenocarcinoma at surgery 02-18-2010, which was robotic hysterectomy/BSO/staging with bladder suspension. She had adjuvant brachytherapy by Dr.Kinard 3000cGy in 5 fractions. She did well until she was found to have a metastatic nodule in the anterior vaginal wall in fall 2012, with no evidence of other involvement. She was treated with external beam radiation therapy 02-20-11 thru 03-27-11 then intracavitary brachytherapy x4 thru 05-02-11 by Dr Roselind Messier. She had initial good shrinkage of the tumor nodule after radiation, then in April 2013 was found to have new ulcerations in distal vagina which were biopsy proven to be progressive disease. Repeat CT AP had no evidence of other metastatic disease, tho reportedly had some changes consistent with radiation therapy. As surgery would likely require anterior exoneration with urinary diversion, recommendation was for systemic chemotherapy using carboplatin at AUC=5 day 1 and taxol 80 mg/m2 days 04-24-13 every 28 days. Cycle 1 day 1 was given 08-29-11, then day 8 held with symptoms that improved when antihypertensive was discontinued; she had cycle 1 day 15 on 09-13-11. She is to see Dr Clifton James again after third cycle.  Patient had 2 days of abdominal cramps and diarrhea after treatment on 5-29. Those symptoms resolved without intervention (and she did not notify our office). She may have had increased reflux symptoms, possibly related to premedication decadron used at 20 mg 12 hrs prior to taxol, but otherwise diarrhea  would not be expected with this agent. She did not take any laxatives. Bowels have been normal and no abdominal pain subsequently. She has felt well for last several days, doing yard work. She has noticed some increase in the slight vaginal spotting, now needing several light pads in 24 hrs. Note she has been quite a bit more active also. She has had no nausea, no fever, no peripheral neuropathy symptoms. Blood pressure at home today was 128/70. Remainder of 10 point Review of Systems negative.  Objective:  Vital signs in last 24 hours:  BP 152/80  Pulse 79  Temp(Src) 98.2 F (36.8 C) (Oral)  Resp 18  Ht 5\' 2"  (1.575 m)  Wt 167 lb 11.2 oz (76.068 kg)  BMI 30.67 kg/m2 Note home BP 128/70 today. Weight is down 2.5 lbs. She looks very comfortable seated in office; gait is a little unsteady with orthopedic problems.She is not using cane.  HEENT:mucous membranes moist, pharynx normal without lesions. PERRL.Not icteric. Hair is thinning LymphaticsCervical, supraclavicular, and axillary nodes normal.No inguinal adenopathy Resp: clear to auscultation bilaterally and normal percussion bilaterally Cardio: regular rate and rhythm GI: soft, non-tender; bowel sounds normal; no masses,  no organomegaly Extremities: extremities normal, atraumatic, no cyanosis or edema Neuro:no sensory deficits noted  Lab Results:   Basename 09/22/11 1352  WBC 4.2  HGB 12.3  HCT 35.7  PLT 167   Hgb is up from 1.9 last week BMET  Basename 09/22/11 1352  NA 142  K 4.1  CL 105  CO2 28  GLUCOSE 101*  BUN 12  CREATININE 0.92  CALCIUM 9.2  Remainder of CMET normal  Studies/Results:  No results found.  Medications: I have reviewed the patient's current medications. i have given  her written and oral instructions for decadron 20 mg 12 hrs prior to taxol, with food.  Assessment/Plan: 1.Endometrial carcinoma recurrent to vagina: She will have day 1 cycle 2 on 09-26-11 using labs from today. I will see her  6-17 prior to day 8 treatment on 6-18, then day 15 on 6-25.  2.Dizziness resolved with DC lisinopril. Home BPs are better than in office. 3.hx osteoporosis, past strep fasciitis, nephrolithiasis Patient was comfortable with our discussion and plan  Serigne Kubicek P, MD   09/22/2011, 7:47 PM

## 2011-09-22 NOTE — Patient Instructions (Signed)
Decadron (dexamethasone, steroid) 4 mg ---  Five tablets (=20 mg) 12 hrs before taxol, ~ 8:30 pm on Monday June 10, with food  Call if diarrhea after chemo

## 2011-09-22 NOTE — Telephone Encounter (Signed)
appts made and printed for pt aom °

## 2011-09-26 ENCOUNTER — Other Ambulatory Visit: Payer: Medicare Other | Admitting: Lab

## 2011-09-26 ENCOUNTER — Ambulatory Visit (HOSPITAL_BASED_OUTPATIENT_CLINIC_OR_DEPARTMENT_OTHER): Payer: Medicare Other

## 2011-09-26 VITALS — BP 126/62 | HR 74 | Temp 97.2°F

## 2011-09-26 DIAGNOSIS — C549 Malignant neoplasm of corpus uteri, unspecified: Secondary | ICD-10-CM

## 2011-09-26 DIAGNOSIS — C541 Malignant neoplasm of endometrium: Secondary | ICD-10-CM

## 2011-09-26 DIAGNOSIS — Z5111 Encounter for antineoplastic chemotherapy: Secondary | ICD-10-CM

## 2011-09-26 MED ORDER — SODIUM CHLORIDE 0.9 % IV SOLN
386.0000 mg | Freq: Once | INTRAVENOUS | Status: AC
  Administered 2011-09-26: 390 mg via INTRAVENOUS
  Filled 2011-09-26: qty 39

## 2011-09-26 MED ORDER — FAMOTIDINE IN NACL 20-0.9 MG/50ML-% IV SOLN
20.0000 mg | Freq: Once | INTRAVENOUS | Status: AC
  Administered 2011-09-26: 20 mg via INTRAVENOUS

## 2011-09-26 MED ORDER — SODIUM CHLORIDE 0.9 % IV SOLN
Freq: Once | INTRAVENOUS | Status: AC
  Administered 2011-09-26: 09:00:00 via INTRAVENOUS

## 2011-09-26 MED ORDER — DIPHENHYDRAMINE HCL 50 MG/ML IJ SOLN
25.0000 mg | Freq: Once | INTRAMUSCULAR | Status: AC
  Administered 2011-09-26: 25 mg via INTRAVENOUS

## 2011-09-26 MED ORDER — ONDANSETRON 16 MG/50ML IVPB (CHCC)
16.0000 mg | Freq: Once | INTRAVENOUS | Status: AC
  Administered 2011-09-26: 16 mg via INTRAVENOUS

## 2011-09-26 MED ORDER — PACLITAXEL CHEMO INJECTION 300 MG/50ML
80.0000 mg/m2 | Freq: Once | INTRAVENOUS | Status: AC
  Administered 2011-09-26: 150 mg via INTRAVENOUS
  Filled 2011-09-26: qty 25

## 2011-09-26 MED ORDER — DEXAMETHASONE SODIUM PHOSPHATE 4 MG/ML IJ SOLN
20.0000 mg | Freq: Once | INTRAMUSCULAR | Status: AC
  Administered 2011-09-26: 20 mg via INTRAVENOUS

## 2011-09-26 NOTE — Patient Instructions (Signed)
[facility] Discharge Instructions for Patients Receiving Chemotherapy  Today you received the following chemotherapy agents Taxol and Carboplatin.  To help prevent nausea and vomiting after your treatment, we encourage you to take your nausea medication.   If you develop nausea and vomiting that is not controlled by your nausea medication, call the clinic. If it is after clinic hours your family physician or the after hours number for the clinic or go to the Emergency Department.   BELOW ARE SYMPTOMS THAT SHOULD BE REPORTED IMMEDIATELY:  *FEVER GREATER THAN 100.5 F  *CHILLS WITH OR WITHOUT FEVER  NAUSEA AND VOMITING THAT IS NOT CONTROLLED WITH YOUR NAUSEA MEDICATION  *UNUSUAL SHORTNESS OF BREATH  *UNUSUAL BRUISING OR BLEEDING  TENDERNESS IN MOUTH AND THROAT WITH OR WITHOUT PRESENCE OF ULCERS  *URINARY PROBLEMS  *BOWEL PROBLEMS  UNUSUAL RASH Items with * indicate a potential emergency and should be followed up as soon as possible.  One of the nurses will contact you 24 hours after your treatment. Please let the nurse know about any problems that you may have experienced. Feel free to call the clinic you have any questions or concerns. The clinic phone number is [phone].   I have been informed and understand all the instructions given to me. I know to contact the clinic, my physician, or go to the Emergency Department if any problems should occur. I do not have any questions at this time, but understand that I may call the clinic during office hours   should I have any questions or need assistance in obtaining follow up care.    __________________________________________  _____________  __________ Signature of Patient or Authorized Representative            Date                   Time    __________________________________________ Nurse's Signature    

## 2011-09-27 ENCOUNTER — Other Ambulatory Visit: Payer: Self-pay | Admitting: Internal Medicine

## 2011-09-27 DIAGNOSIS — Z1231 Encounter for screening mammogram for malignant neoplasm of breast: Secondary | ICD-10-CM

## 2011-09-28 ENCOUNTER — Telehealth: Payer: Self-pay | Admitting: Medical Oncology

## 2011-09-28 NOTE — Telephone Encounter (Addendum)
Pt requests port a cath due to unsuccessful venipuntures at last chemotherapy.States she is going to have chemo through October. Pt has labs and OV on Monday and chemo Tuesday. I called IR and they have an opening Tuesday at 0715. I did get pts chemo schedule changed from 0830 to 1045 to accommodate port procedure .  IR will call Dr Precious Reel office tomorrow for f/u.   I will leave note for  Dr Darrold Span re pt request.

## 2011-09-29 ENCOUNTER — Telehealth: Payer: Self-pay

## 2011-09-29 ENCOUNTER — Other Ambulatory Visit: Payer: Self-pay | Admitting: Radiology

## 2011-09-29 ENCOUNTER — Other Ambulatory Visit: Payer: Self-pay

## 2011-09-29 DIAGNOSIS — C541 Malignant neoplasm of endometrium: Secondary | ICD-10-CM

## 2011-09-29 NOTE — Telephone Encounter (Signed)
[name] that Dr. Darrold Span is fine with PAC placement.  Order put into epic for placement.

## 2011-10-02 ENCOUNTER — Encounter: Payer: Self-pay | Admitting: Oncology

## 2011-10-02 ENCOUNTER — Ambulatory Visit (HOSPITAL_BASED_OUTPATIENT_CLINIC_OR_DEPARTMENT_OTHER): Payer: Medicare Other | Admitting: Oncology

## 2011-10-02 ENCOUNTER — Other Ambulatory Visit (HOSPITAL_BASED_OUTPATIENT_CLINIC_OR_DEPARTMENT_OTHER): Payer: Medicare Other

## 2011-10-02 ENCOUNTER — Other Ambulatory Visit: Payer: Self-pay | Admitting: Radiology

## 2011-10-02 ENCOUNTER — Other Ambulatory Visit: Payer: Self-pay

## 2011-10-02 VITALS — BP 142/82 | HR 76 | Temp 97.2°F | Ht 62.0 in | Wt 160.6 lb

## 2011-10-02 DIAGNOSIS — C541 Malignant neoplasm of endometrium: Secondary | ICD-10-CM

## 2011-10-02 DIAGNOSIS — M810 Age-related osteoporosis without current pathological fracture: Secondary | ICD-10-CM

## 2011-10-02 DIAGNOSIS — C549 Malignant neoplasm of corpus uteri, unspecified: Secondary | ICD-10-CM

## 2011-10-02 LAB — CBC WITH DIFFERENTIAL/PLATELET
BASO%: 0.4 % (ref 0.0–2.0)
EOS%: 2.5 % (ref 0.0–7.0)
HGB: 12.6 g/dL (ref 11.6–15.9)
MCH: 33.6 pg (ref 25.1–34.0)
MCHC: 34.6 g/dL (ref 31.5–36.0)
RBC: 3.76 10*6/uL (ref 3.70–5.45)
RDW: 12.9 % (ref 11.2–14.5)
lymph#: 0.8 10*3/uL — ABNORMAL LOW (ref 0.9–3.3)

## 2011-10-02 MED ORDER — LIDOCAINE-PRILOCAINE 2.5-2.5 % EX CREA: TOPICAL_CREAM | CUTANEOUS | Status: AC

## 2011-10-02 NOTE — Patient Instructions (Signed)
When you come for chemotherapy tomorrow, have nurse show you where to put EMLA cream onto portacath for the next week's treatment

## 2011-10-02 NOTE — Progress Notes (Signed)
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 Peripheral IV access has been more difficult and patient is to have PAC by IR tomorrow am prior to chemotherapy later the same day. Patient had initially presented with postmenopausal bleeding to Dr.Diana Thomasena Edis and was referred to Dr.Elizabeth Skinner. She had stage 1 grade B2 endometrioid adenocarcinoma at surgery 02-18-2010, which was robotic hysterectomy/BSO/staging with bladder suspension. She had adjuvant brachytherapy by Dr.Kinard 3000cGy in 5 fractions. She did well until she was found to have a metastatic nodule in the anterior vaginal wall in fall 2012, with no evidence of other involvement. She was treated with external beam radiation therapy 02-20-11 thru 03-27-11 then intracavitary brachytherapy x4 thru 05-02-11 by Dr Roselind Messier. She had initial good shrinkage of the tumor nodule after radiation, then in April 2013 was found to have new ulcerations in distal vagina which were biopsy proven to be progressive disease. Repeat CT AP had no evidence of other metastatic disease, tho reportedly had some changes consistent with radiation therapy. As surgery would likely require anterior exoneration with urinary diversion, recommendation was for systemic chemotherapy using carboplatin at AUC=5 day 1 and taxol 80 mg/m2 days 04-24-13 every 28 days. Cycle 1 day 1 was given 08-29-11, then day 8 held with symptoms that improved when antihypertensive was discontinued; she had cycle 1 day 15 on 09-13-11. She is to see Dr Clifton James next ~ Aug 15, although she has had some increase in vaginal bleeding and she may need to be seen back by Dr Clifton James prior to August.  Patient has been somewhat more tired, tho feels better today. Nausea has been  controlled and bowels are moving. She has some hair thinning without complete alopecia. She has had no fever or symptoms of infection. She has some resolving bruises from multiple IV access attempts, but nothing of more concern there. She is using a light pad every few hours for vaginal bleeding. She has no other bleeding. She has lost weight, but seems to be eating well again now. Remainder of 10 point Review of Systems negative.  Objective:  Vital signs in last 24 hours:  BP 142/82  Pulse 76  Temp 97.2 F (36.2 C) (Oral)  Ht 5\' 2"  (1.575 m)  Wt 160 lb 9.6 oz (72.848 kg)  BMI 29.37 kg/m2 Weight is down about 6 lbs as recorded in EMR. No obvious alopecia. Ambulatory as previously.  HEENT:mucous membranes moist, pharynx normal without lesions LymphaticsCervical, supraclavicular, and axillary nodes normal. Resp: clear to auscultation bilaterally and normal percussion bilaterally Cardio: regular rate and rhythm GI: soft, non-tender; bowel sounds normal; no masses,  no organomegaly Extremities: extremities normal, atraumatic, no cyanosis or edema. Resolving ecchymoses bilateral forearms. Neuro:nonfocal Skin without rash or petechiae  Lab Results:   Basename 10/02/11 1201  WBC 3.3*  HGB 12.6  HCT 36.5  PLT 175  ANC 2.2 This hgb is up from 12.3 last week BMET Last CMET 09-22-11 normal (glucose 101) Studies/Results:  No results found.  Medications: I have reviewed the patient's current medications.  Assessment/Plan: 1. Endometrial carcinoma progressive in vagina since radiation, now receiving weekly chemotherapy with carbo/taxol. Light vaginal bleeding continues; if not improving she may need to see Dr Clifton James prior to completing cycle 3. I will see her at least July 8, or sooner if  needed. 2. For PAC by IR prior to chemo 10-03-11 3. BP still in reasonable range off antihypertensives due to symptomatic hypotension as we began chemo. 4.osteoporosis, past strep fasciitis LE,  nephrolithiasis  Patient is in agreement with plan as above   Jolanta Cabeza P, MD   10/02/2011, 9:41 PM

## 2011-10-03 ENCOUNTER — Encounter (HOSPITAL_COMMUNITY): Payer: Self-pay

## 2011-10-03 ENCOUNTER — Ambulatory Visit (HOSPITAL_BASED_OUTPATIENT_CLINIC_OR_DEPARTMENT_OTHER): Payer: Medicare Other

## 2011-10-03 ENCOUNTER — Ambulatory Visit (HOSPITAL_COMMUNITY)
Admission: RE | Admit: 2011-10-03 | Discharge: 2011-10-03 | Disposition: A | Discharge status: Home / Self Care | Payer: Medicare Other | Source: Ambulatory Visit | Attending: Oncology | Admitting: Oncology

## 2011-10-03 ENCOUNTER — Other Ambulatory Visit: Payer: Self-pay | Admitting: Oncology

## 2011-10-03 VITALS — BP 116/76 | HR 77

## 2011-10-03 VITALS — BP 128/77 | HR 75 | Temp 97.7°F | Resp 14

## 2011-10-03 DIAGNOSIS — Z90710 Acquired absence of both cervix and uterus: Secondary | ICD-10-CM | POA: Insufficient documentation

## 2011-10-03 DIAGNOSIS — C541 Malignant neoplasm of endometrium: Secondary | ICD-10-CM

## 2011-10-03 DIAGNOSIS — I10 Essential (primary) hypertension: Secondary | ICD-10-CM | POA: Insufficient documentation

## 2011-10-03 DIAGNOSIS — C549 Malignant neoplasm of corpus uteri, unspecified: Secondary | ICD-10-CM | POA: Insufficient documentation

## 2011-10-03 DIAGNOSIS — E7800 Pure hypercholesterolemia, unspecified: Secondary | ICD-10-CM | POA: Insufficient documentation

## 2011-10-03 DIAGNOSIS — Z5111 Encounter for antineoplastic chemotherapy: Secondary | ICD-10-CM

## 2011-10-03 DIAGNOSIS — Z9079 Acquired absence of other genital organ(s): Secondary | ICD-10-CM | POA: Insufficient documentation

## 2011-10-03 DIAGNOSIS — Z923 Personal history of irradiation: Secondary | ICD-10-CM | POA: Insufficient documentation

## 2011-10-03 LAB — CBC
HCT: 38.4 % (ref 36.0–46.0)
MCH: 32.5 pg (ref 26.0–34.0)
MCHC: 34.4 g/dL (ref 30.0–36.0)
MCV: 94.6 fL (ref 78.0–100.0)
Platelets: 194 10*3/uL (ref 150–400)
RDW: 12.4 % (ref 11.5–15.5)
WBC: 2.4 10*3/uL — ABNORMAL LOW (ref 4.0–10.5)

## 2011-10-03 MED ORDER — SODIUM CHLORIDE 0.9 % IV SOLN
Freq: Once | INTRAVENOUS | Status: AC
  Administered 2011-10-03: 08:00:00 via INTRAVENOUS

## 2011-10-03 MED ORDER — FENTANYL CITRATE 0.05 MG/ML IJ SOLN
INTRAMUSCULAR | Status: AC
  Filled 2011-10-03: qty 6

## 2011-10-03 MED ORDER — FENTANYL CITRATE 0.05 MG/ML IJ SOLN
INTRAMUSCULAR | Status: AC | PRN
  Administered 2011-10-03: 100 ug via INTRAVENOUS

## 2011-10-03 MED ORDER — PACLITAXEL CHEMO INJECTION 300 MG/50ML
80.0000 mg/m2 | Freq: Once | INTRAVENOUS | Status: AC
  Administered 2011-10-03: 150 mg via INTRAVENOUS
  Filled 2011-10-03: qty 25

## 2011-10-03 MED ORDER — ONDANSETRON 16 MG/50ML IVPB (CHCC)
16.0000 mg | Freq: Once | INTRAVENOUS | Status: AC
  Administered 2011-10-03: 16 mg via INTRAVENOUS

## 2011-10-03 MED ORDER — MIDAZOLAM HCL 2 MG/2ML IJ SOLN
INTRAMUSCULAR | Status: AC
  Filled 2011-10-03: qty 6

## 2011-10-03 MED ORDER — DEXAMETHASONE SODIUM PHOSPHATE 4 MG/ML IJ SOLN
20.0000 mg | Freq: Once | INTRAMUSCULAR | Status: AC
  Administered 2011-10-03: 20 mg via INTRAVENOUS

## 2011-10-03 MED ORDER — SODIUM CHLORIDE 0.9 % IV SOLN
Freq: Once | INTRAVENOUS | Status: AC
  Administered 2011-10-03: 11:00:00 via INTRAVENOUS

## 2011-10-03 MED ORDER — CEFAZOLIN SODIUM 1-5 GM-% IV SOLN
1.0000 g | Freq: Once | INTRAVENOUS | Status: AC
  Administered 2011-10-03: 1 g via INTRAVENOUS

## 2011-10-03 MED ORDER — CEFAZOLIN SODIUM 1-5 GM-% IV SOLN
INTRAVENOUS | Status: AC
  Filled 2011-10-03: qty 50

## 2011-10-03 MED ORDER — HEPARIN SOD (PORK) LOCK FLUSH 100 UNIT/ML IV SOLN
500.0000 [IU] | Freq: Once | INTRAVENOUS | Status: AC | PRN
  Administered 2011-10-03: 500 [IU]
  Filled 2011-10-03: qty 5

## 2011-10-03 MED ORDER — SODIUM CHLORIDE 0.9 % IJ SOLN
10.0000 mL | INTRAMUSCULAR | Status: DC | PRN
  Administered 2011-10-03: 10 mL
  Filled 2011-10-03: qty 10

## 2011-10-03 MED ORDER — MIDAZOLAM HCL 5 MG/5ML IJ SOLN
INTRAMUSCULAR | Status: AC | PRN
  Administered 2011-10-03 (×2): 1 mg via INTRAVENOUS

## 2011-10-03 MED ORDER — DIPHENHYDRAMINE HCL 50 MG/ML IJ SOLN
25.0000 mg | Freq: Once | INTRAMUSCULAR | Status: AC
  Administered 2011-10-03: 25 mg via INTRAVENOUS

## 2011-10-03 MED ORDER — FAMOTIDINE IN NACL 20-0.9 MG/50ML-% IV SOLN
20.0000 mg | Freq: Once | INTRAVENOUS | Status: AC
  Administered 2011-10-03: 20 mg via INTRAVENOUS

## 2011-10-03 NOTE — Procedures (Signed)
Successful placement of right jugular approach port-a-cath with tip at superior aspect of the right atrium. The catheter is ready for immediate use. No immediate post procedural complications.  

## 2011-10-03 NOTE — Discharge Instructions (Signed)
Implanted Port Instructions An implanted port is a central line that has a round shape and is placed under the skin. It is used for long-term IV (intravenous) access for:  Medicine.   Fluids.   Liquid nutrition, such as TPN (total parenteral nutrition).   Blood samples.  Ports can be placed:  In the chest area just below the collarbone (this is the most common place.)   In the arms.   In the belly (abdomen) area.   In the legs.  PARTS OF THE PORT A port has 2 main parts:  The reservoir. The reservoir is round, disc-shaped, and will be a small, raised area under your skin.   The reservoir is the part where a needle is inserted (accessed) to either give medicines or to draw blood.   The catheter. The catheter is a long, slender tube that extends from the reservoir. The catheter is placed into a large vein.   Medicine that is inserted into the reservoir goes into the catheter and then into the vein.  INSERTION OF THE PORT  The port is surgically placed in either an operating room or in a procedural area (interventional radiology).   Medicine may be given to help you relax during the procedure.   The skin where the port will be inserted is numbed (local anesthetic).   1 or 2 small cuts (incisions) will be made in the skin to insert the port.   The port can be used after it has been inserted.  INCISION SITE CARE  The incision site may have small adhesive strips on it. This helps keep the incision site closed. Sometimes, no adhesive strips are placed. Instead of adhesive strips, a special kind of surgical glue is used to keep the incision closed.   If adhesive strips were placed on the incision sites, do not take them off. They will fall off on their own.   The incision site may be sore for 1 to 2 days. Pain medicine can help.   Do not get the incision site wet. Bathe or shower as directed by your caregiver.   The incision site should heal in 5 to 7 days. A small scar may  form after the incision has healed.  ACCESSING THE PORT Special steps must be taken to access the port:  Before the port is accessed, a numbing cream can be placed on the skin. This helps numb the skin over the port site.   A sterile technique is used to access the port.   The port is accessed with a needle. Only "non-coring" port needles should be used to access the port. Once the port is accessed, a blood return should be checked. This helps ensure the port is in the vein and is not clogged (clotted).   If your caregiver believes your port should remain accessed, a clear (transparent) bandage will be placed over the needle site. The bandage and needle will need to be changed every week or as directed by your caregiver.   Keep the bandage covering the needle clean and dry. Do not get it wet. Follow your caregiver's instructions on how to take a shower or bath when the port is accessed.   If your port does not need to stay accessed, no bandage is needed over the port.  FLUSHING THE PORT Flushing the port keeps it from getting clogged. How often the port is flushed depends on:  If a constant infusion is running. If a constant infusion is running, the   port may not need to be flushed.   If intermittent medicines are given.  If the port is not being used. Moderate Sedation, Adult Moderate sedation is given to help you relax or even sleep through a procedure. You may remain sleepy, be clumsy, or have poor balance for several hours following this procedure. Arrange for a responsible adult, family member, or friend to take you home. A responsible adult should stay with you for at least 24 hours or until the medicines have worn off. Do not participate in any activities where you could become injured for the next 24 hours, or until you feel normal again. Do not:  Drive.  Swim.  Ride a bicycle.  Operate heavy machinery.  Cook.  Use power tools.  Climb ladders.  Work at heights.  Do not make  important decisions or sign legal documents until you are improved.  Vomiting may occur if you eat too soon. When you can drink without vomiting, try water, juice, or soup. Try solid foods if you feel little or no nausea.  Only take over-the-counter or prescription medications for pain, discomfort, or fever as directed by your caregiver.If pain medications have been prescribed for you, ask your caregiver how soon it is safe to take them.  Make sure you and your family fully understands everything about the medication given to you. Make sure you understand what side effects may occur.  You should not drink alcohol, take sleeping pills, or medications that cause drowsiness for at least 24 hours.  If you smoke, do not smoke alone.  If you are feeling better, you may resume normal activities 24 hours after receiving sedation.  Keep all appointments as scheduled. Follow all instructions.  Ask questions if you do not understand.  SEEK MEDICAL CARE IF:  Your skin is pale or bluish in color.  You continue to feel sick to your stomach (nauseous) or throw up (vomit).  Your pain is getting worse and not helped by medication.  You have bleeding or swelling.  You are still sleepy or feeling clumsy after 24 hours.  SEEK IMMEDIATE MEDICAL CARE IF:  You develop a rash.  You have difficulty breathing.  You develop any type of allergic problem.  You have a fever.  Document Released: 12/27/2000 Document Revised: 03/23/2011 Document Reviewed: 05/20/2007  ExitCare Patient Information 2012 [employer]. For intermittent medicines:  The port will need to be flushed:   After medicines have been given.   After blood has been drawn.   As part of routine maintenance.   A port is normally flushed with:   Normal saline.   Heparin.   Follow your caregiver's advice on how often, how much, and the type of flush to use on your port.  IMPORTANT PORT INFORMATION  Tell your caregiver if you are allergic to  heparin.   After your port is placed, you will get a manufacturer's information card. The card has information about your port. Keep this card with you at all times.   There are many types of ports available. Know what kind of port you have.   In case of an emergency, it may be helpful to wear a medical alert bracelet. This can help alert health care workers that you have a port.   The port can stay in for as long as your caregiver believes it is necessary.   When it is time for the port to come out, surgery will be done to remove it. The surgery will be   similar to how the port was put in.   If you are in the hospital or clinic:   Your port will be taken care of and flushed by a nurse.   If you are at home:   A home health care nurse may give medicines and take care of the port.   You or a family member can get special training and directions for giving medicine and taking care of the port at home.  SEEK IMMEDIATE MEDICAL CARE IF:   Your port does not flush or you are unable to get a blood return.   New drainage or pus is coming from the incision.   A bad smell is coming from the incision site.   You develop swelling or increased redness at the incision site.   You develop increased swelling or pain at the port site.   You develop swelling or pain in the surrounding skin near the port.   You have an oral temperature above 102 F (38.9 C), not controlled by medicine.  MAKE SURE YOU:   Understand these instructions.   Will watch your condition.   Will get help right away if you are not doing well or get worse.  Document Released: 04/03/2005 Document Revised: 03/23/2011 Document Reviewed: 06/25/2008 Exit[employer] Patient Information 2012 [employer]. 

## 2011-10-03 NOTE — H&P (Signed)
[name] is an 78 y.o. female.   Chief Complaint: endometrial ca diagnosed in 2011; now with recurrance Scheduled for Port a cath placement for chemo HPI: HTN; high cholesterol  Past Medical History  Diagnosis Date  . Endometrial adenocarcinoma 9/22/211    dx  endometrium bx  . Hypertension   . Hypercholesterolemia   . Chronic kidney disease kidney stones,cystocele  . Cataract forming,no surgery as yet  . Arthritis     osteopenia  . Hx of radiation therapy 06/21/2010-07/18/2010    proximal vagina  . Anxiety   . Vaginal bleeding   . Liver cyst     benign  . History of cervical polypectomy     endocervical polypectomy 5 years ago    Past Surgical History  Procedure Date  . Parathyroid adenoma   . Abdominal hysterectomy total  w/ b/l salpingoopherectomy  . Breast reduction surgery   . Infected skin debridement     debridex3 r thigh,myositis     No family history on file. Social History:  reports that she has never smoked. She has never used smokeless tobacco. Her alcohol and drug histories not on file.  Allergies:  Allergies  Allergen Reactions  . Voltaren (Diclofenac Sodium)     Swelling/rash     (Not in a hospital admission)  Results for orders placed during the hospital encounter of 10/03/11 (from the past 48 hour(s))  APTT     Status: Normal   Collection Time   10/03/11  7:33 AM      Component Value Range Comment   aPTT 33  24 - 37 seconds   CBC     Status: Abnormal   Collection Time   10/03/11  7:33 AM      Component Value Range Comment   WBC 2.4 (*) 4.0 - 10.5 K/uL    RBC 4.06  3.87 - 5.11 MIL/uL    Hemoglobin 13.2  12.0 - 15.0 g/dL    HCT 16.1  09.6 - 04.5 %    MCV 94.6  78.0 - 100.0 fL    MCH 32.5  26.0 - 34.0 pg    MCHC 34.4  30.0 - 36.0 g/dL    RDW 40.9  81.1 - 91.4 %    Platelets 194  150 - 400 K/uL   PROTIME-INR     Status: Normal   Collection Time   10/03/11  7:33 AM      Component Value Range Comment   Prothrombin Time 12.3  11.6 -  15.2 seconds    INR 0.90  0.00 - 1.49    No results found.  Review of Systems  Constitutional: Negative for fever.  Respiratory: Negative for cough and shortness of breath.   Cardiovascular: Negative for chest pain.  Gastrointestinal: Negative for nausea and vomiting.  Neurological: Negative for dizziness and headaches.    Blood pressure 132/81, pulse 80, temperature 97.7 F (36.5 C), temperature source Oral, resp. rate 16, SpO2 94.00%. Physical Exam  Constitutional: She is oriented to person, place, and time. She appears well-developed and well-nourished.  Cardiovascular: Normal rate, regular rhythm and normal heart sounds.   No murmur heard. Respiratory: Effort normal and breath sounds normal. She has no wheezes.  GI: Soft. Bowel sounds are normal. There is no tenderness.  Musculoskeletal: Normal range of motion. She exhibits no tenderness.  Neurological: She is alert and oriented to person, place, and time. Coordination normal.  Psychiatric: She has a normal mood and affect. Her behavior is normal. Judgment  and thought content normal.     Assessment/Plan Endometrial ca- recurrance Scheduled for PAC placement today in IR Pt aware of procedure benefits and risks and agreeable to proceed. Consent signed.  Vista Sawatzky A 10/03/2011, 8:15 AM

## 2011-10-04 NOTE — Progress Notes (Signed)
[name] d. RN in the infusion deacessed Pt.'s PAC after treatment yesterday per Tammi RN and did show her where to place the EMLA cream over [facility] for next treatment.

## 2011-10-09 ENCOUNTER — Other Ambulatory Visit: Payer: Medicare Other | Admitting: Lab

## 2011-10-09 ENCOUNTER — Other Ambulatory Visit (HOSPITAL_BASED_OUTPATIENT_CLINIC_OR_DEPARTMENT_OTHER): Payer: Medicare Other | Admitting: Lab

## 2011-10-09 ENCOUNTER — Telehealth: Payer: Self-pay

## 2011-10-09 DIAGNOSIS — C541 Malignant neoplasm of endometrium: Secondary | ICD-10-CM

## 2011-10-09 DIAGNOSIS — C549 Malignant neoplasm of corpus uteri, unspecified: Secondary | ICD-10-CM

## 2011-10-09 LAB — CBC WITH DIFFERENTIAL/PLATELET
BASO%: 0.9 % (ref 0.0–2.0)
HCT: 33.5 % — ABNORMAL LOW (ref 34.8–46.6)
LYMPH%: 34.7 % (ref 14.0–49.7)
MCH: 32.5 pg (ref 25.1–34.0)
MCHC: 34.6 g/dL (ref 31.5–36.0)
MCV: 93.8 fL (ref 79.5–101.0)
MONO%: 14.1 % — ABNORMAL HIGH (ref 0.0–14.0)
NEUT%: 48 % (ref 38.4–76.8)
Platelets: 156 10*3/uL (ref 145–400)
RBC: 3.57 10*6/uL — ABNORMAL LOW (ref 3.70–5.45)
WBC: 2.1 10*3/uL — ABNORMAL LOW (ref 3.9–10.3)
nRBC: 0 % (ref 0–0)

## 2011-10-09 NOTE — Telephone Encounter (Signed)
Told [name] that her Anc today was 1.0. It needs to be 1.5 or greater per Dr. Precious Reel parameters to receive treatment tomorrow. Pt. Feels fine and is afebrile.   Told her that treatment will be cancelled for tomorrow and will discuss rescheduling with Dr. Darrold Span upon her return tomorrow.

## 2011-10-10 ENCOUNTER — Ambulatory Visit: Payer: Medicare Other

## 2011-10-10 ENCOUNTER — Other Ambulatory Visit: Payer: Self-pay | Admitting: Oncology

## 2011-10-10 ENCOUNTER — Telehealth: Payer: Self-pay

## 2011-10-10 ENCOUNTER — Encounter: Payer: Medicare Other | Admitting: Nutrition

## 2011-10-10 ENCOUNTER — Other Ambulatory Visit: Payer: Medicare Other | Admitting: Lab

## 2011-10-10 ENCOUNTER — Other Ambulatory Visit: Payer: Self-pay

## 2011-10-10 DIAGNOSIS — C541 Malignant neoplasm of endometrium: Secondary | ICD-10-CM

## 2011-10-10 NOTE — Telephone Encounter (Signed)
Spoke with [name] and told her that Dr. Darrold Span said that her labs can be checked next week to see if she can ge her treatment next week.   She will come at 3pm 10-16-11 and wait for the results.  She will have treatment on 10-17-11 at 0845 if counts good.   Pt. Verbalized understanding.

## 2011-10-11 ENCOUNTER — Ambulatory Visit: Payer: Medicare Other | Admitting: Oncology

## 2011-10-11 ENCOUNTER — Other Ambulatory Visit: Payer: Medicare Other | Admitting: Lab

## 2011-10-16 ENCOUNTER — Other Ambulatory Visit: Payer: Medicare Other

## 2011-10-16 ENCOUNTER — Telehealth: Payer: Self-pay

## 2011-10-16 DIAGNOSIS — C541 Malignant neoplasm of endometrium: Secondary | ICD-10-CM

## 2011-10-16 LAB — CBC WITH DIFFERENTIAL/PLATELET
BASO%: 0.4 % (ref 0.0–2.0)
Basophils Absolute: 0 10*3/uL (ref 0.0–0.1)
EOS%: 1 % (ref 0.0–7.0)
HGB: 11.6 g/dL (ref 11.6–15.9)
MCH: 33.6 pg (ref 25.1–34.0)
MCHC: 33.8 g/dL (ref 31.5–36.0)
MCV: 99.4 fL (ref 79.5–101.0)
MONO%: 19.1 % — ABNORMAL HIGH (ref 0.0–14.0)
NEUT%: 57.5 % (ref 38.4–76.8)
RDW: 14 % (ref 11.2–14.5)
lymph#: 0.7 10*3/uL — ABNORMAL LOW (ref 0.9–3.3)

## 2011-10-16 NOTE — Telephone Encounter (Signed)
[name]' anc = 1.7 and plt. 145k.  She is able to recive her chemotherapy tomorrow.  Reviewed placement of EMLA cream and to take decadron premed at 9pm this evening.  Pt. Verbalized understanding.

## 2011-10-17 ENCOUNTER — Ambulatory Visit: Payer: Medicare Other | Admitting: Nutrition

## 2011-10-17 ENCOUNTER — Ambulatory Visit (HOSPITAL_BASED_OUTPATIENT_CLINIC_OR_DEPARTMENT_OTHER): Payer: Medicare Other

## 2011-10-17 VITALS — BP 176/80 | HR 98 | Temp 97.7°F

## 2011-10-17 DIAGNOSIS — Z5111 Encounter for antineoplastic chemotherapy: Secondary | ICD-10-CM

## 2011-10-17 DIAGNOSIS — C549 Malignant neoplasm of corpus uteri, unspecified: Secondary | ICD-10-CM

## 2011-10-17 DIAGNOSIS — C541 Malignant neoplasm of endometrium: Secondary | ICD-10-CM

## 2011-10-17 MED ORDER — HEPARIN SOD (PORK) LOCK FLUSH 100 UNIT/ML IV SOLN
500.0000 [IU] | Freq: Once | INTRAVENOUS | Status: AC | PRN
  Administered 2011-10-17: 500 [IU]
  Filled 2011-10-17: qty 5

## 2011-10-17 MED ORDER — DIPHENHYDRAMINE HCL 50 MG/ML IJ SOLN
25.0000 mg | Freq: Once | INTRAMUSCULAR | Status: AC
  Administered 2011-10-17: 25 mg via INTRAVENOUS

## 2011-10-17 MED ORDER — SODIUM CHLORIDE 0.9 % IJ SOLN
10.0000 mL | INTRAMUSCULAR | Status: DC | PRN
  Administered 2011-10-17: 10 mL
  Filled 2011-10-17: qty 10

## 2011-10-17 MED ORDER — FAMOTIDINE IN NACL 20-0.9 MG/50ML-% IV SOLN
20.0000 mg | Freq: Once | INTRAVENOUS | Status: AC
  Administered 2011-10-17: 20 mg via INTRAVENOUS

## 2011-10-17 MED ORDER — ONDANSETRON 16 MG/50ML IVPB (CHCC)
16.0000 mg | Freq: Once | INTRAVENOUS | Status: AC
  Administered 2011-10-17: 16 mg via INTRAVENOUS

## 2011-10-17 MED ORDER — DEXAMETHASONE SODIUM PHOSPHATE 4 MG/ML IJ SOLN
20.0000 mg | Freq: Once | INTRAMUSCULAR | Status: AC
  Administered 2011-10-17: 20 mg via INTRAVENOUS

## 2011-10-17 MED ORDER — PACLITAXEL CHEMO INJECTION 300 MG/50ML
80.0000 mg/m2 | Freq: Once | INTRAVENOUS | Status: AC
  Administered 2011-10-17: 150 mg via INTRAVENOUS
  Filled 2011-10-17: qty 25

## 2011-10-17 MED ORDER — SODIUM CHLORIDE 0.9 % IV SOLN
Freq: Once | INTRAVENOUS | Status: AC
  Administered 2011-10-17: 09:00:00 via INTRAVENOUS

## 2011-10-17 NOTE — Assessment & Plan Note (Signed)
[name] is an 78 year old female patient of Dr. Darrold Span diagnosed with a recurrence of endometrial cancer.  MEDICAL HISTORY INCLUDES:  Hypertension, hypercholesterolemia, chronic kidney disease, arthritis and anxiety.  MEDICATIONS INCLUDE:  Biotin, vitamin D, Decadron, omega 3 fatty acids, glucosamine chondroitin, Ativan, multivitamin, Zofran, and Crestor.  LABS:  Glucose of 101 on June 7.  HEIGHT:  62 inches. WEIGHT:  160.6 pounds June 17th. USUAL BODY WEIGHT:  170 pounds May 28. BMI:  29.37.  The patient reports somewhat of a poor appetite and difficulty eating for several days after chemotherapy.  She states that she typically enjoys fruits and vegetables, and had questions today about juicing. Patient has not consumed any nutrition supplements at this time.  NUTRITION DIAGNOSIS:  Unintended weight loss related to decreased oral intake secondary to recurrence of endometrial cancer and associated treatments as evidenced by a 10 pound weight loss in 1 month.  INTERVENTION:  I have educated the patient to consume smaller, more frequent meals consuming higher calorie, higher protein foods to promote weight maintenance.  I have educated her on specific foods she could incorporate.  I have answered her questions surrounding juicing and provided her with some information on healthy juicing recipes, but have encouraged her to limit juicing to 6-8 ounces daily and to be sure to include additional protein sources.  Again I have stressed the importance of weight maintenance during treatment, proper food handling, and adequate protein.  I provided fact sheets and coupons for nutrition supplement.  MONITORING/EVALUATION (GOALS):  The patient will tolerate a healthy high-protein diet to promote weight maintenance.  NEXT VISIT:  Tuesday, July 9th during chemotherapy.  ______________________________ Zenovia Jarred, RD, LDN Clinical Nutrition Specialist BN/MEDQ  D:  10/17/2011  T:  10/17/2011  Job:   [id number]

## 2011-10-17 NOTE — Patient Instructions (Signed)
[facility] Discharge Instructions for Patients Receiving Chemotherapy  Today you received the following chemotherapy agents Taxol  To help prevent nausea and vomiting after your treatment, we encourage you to take your nausea medication as prescribed.  If you develop nausea and vomiting that is not controlled by your nausea medication, call the clinic. If it is after clinic hours your family physician or the after hours number for the clinic or go to the Emergency Department.   BELOW ARE SYMPTOMS THAT SHOULD BE REPORTED IMMEDIATELY:  *FEVER GREATER THAN 100.5 F  *CHILLS WITH OR WITHOUT FEVER  NAUSEA AND VOMITING THAT IS NOT CONTROLLED WITH YOUR NAUSEA MEDICATION  *UNUSUAL SHORTNESS OF BREATH  *UNUSUAL BRUISING OR BLEEDING  TENDERNESS IN MOUTH AND THROAT WITH OR WITHOUT PRESENCE OF ULCERS  *URINARY PROBLEMS  *BOWEL PROBLEMS  UNUSUAL RASH Items with * indicate a potential emergency and should be followed up as soon as possible.  One of the nurses will contact you 24 hours after your treatment. Please let the nurse know about any problems that you may have experienced. Feel free to call the clinic you have any questions or concerns. The clinic phone number is [phone].   I have been informed and understand all the instructions given to me. I know to contact the clinic, my physician, or go to the Emergency Department if any problems should occur. I do not have any questions at this time, but understand that I may call the clinic during office hours   should I have any questions or need assistance in obtaining follow up care.    __________________________________________  _____________  __________ Signature of Patient or Authorized Representative            Date                   Time    __________________________________________ Nurse's Signature    

## 2011-10-18 ENCOUNTER — Telehealth: Payer: Self-pay

## 2011-10-18 NOTE — Telephone Encounter (Signed)
[name] is having a muscle spasm under her left shoulder blade since yesterday.  Dr. Earl Gala prescribed flexeril 5 mg tabs for this.  She wanted to know if this would be ok to use after her treatment.  Told her it is fine to use.  She stated that she is on a heating pad and it usually only take a couple of doses to take care of the spasm.  Cautioned her to eat prior to taking med and and not to take her ativan if needed with the flexeril as she could become  over sedated.  If nauseous, try the Zofran first as this will not be sedating.  [name] appreciated the information.

## 2011-10-20 ENCOUNTER — Other Ambulatory Visit: Payer: Self-pay

## 2011-10-20 DIAGNOSIS — C541 Malignant neoplasm of endometrium: Secondary | ICD-10-CM

## 2011-10-22 ENCOUNTER — Other Ambulatory Visit: Payer: Self-pay | Admitting: Oncology

## 2011-10-23 ENCOUNTER — Other Ambulatory Visit (HOSPITAL_BASED_OUTPATIENT_CLINIC_OR_DEPARTMENT_OTHER): Payer: Medicare Other | Admitting: Lab

## 2011-10-23 ENCOUNTER — Telehealth: Payer: Self-pay | Admitting: *Deleted

## 2011-10-23 ENCOUNTER — Ambulatory Visit (HOSPITAL_BASED_OUTPATIENT_CLINIC_OR_DEPARTMENT_OTHER): Payer: Medicare Other | Admitting: Oncology

## 2011-10-23 ENCOUNTER — Encounter: Payer: Self-pay | Admitting: Oncology

## 2011-10-23 VITALS — BP 160/80 | HR 100 | Temp 97.4°F | Ht 62.0 in | Wt 163.0 lb

## 2011-10-23 DIAGNOSIS — C541 Malignant neoplasm of endometrium: Secondary | ICD-10-CM

## 2011-10-23 DIAGNOSIS — C549 Malignant neoplasm of corpus uteri, unspecified: Secondary | ICD-10-CM

## 2011-10-23 DIAGNOSIS — M810 Age-related osteoporosis without current pathological fracture: Secondary | ICD-10-CM

## 2011-10-23 LAB — CBC WITH DIFFERENTIAL/PLATELET
EOS%: 2.3 % (ref 0.0–7.0)
HGB: 12.2 g/dL (ref 11.6–15.9)
MCH: 33.8 pg (ref 25.1–34.0)
MCV: 98.7 fL (ref 79.5–101.0)
MONO%: 7.4 % (ref 0.0–14.0)
NEUT#: 2.2 10*3/uL (ref 1.5–6.5)
RBC: 3.61 10*6/uL — ABNORMAL LOW (ref 3.70–5.45)
RDW: 14 % (ref 11.2–14.5)
lymph#: 0.8 10*3/uL — ABNORMAL LOW (ref 0.9–3.3)

## 2011-10-23 LAB — COMPREHENSIVE METABOLIC PANEL
Alkaline Phosphatase: 54 U/L (ref 39–117)
Creatinine, Ser: 0.99 mg/dL (ref 0.50–1.10)
Glucose, Bld: 116 mg/dL — ABNORMAL HIGH (ref 70–99)
Sodium: 138 mEq/L (ref 135–145)
Total Bilirubin: 0.4 mg/dL (ref 0.3–1.2)
Total Protein: 6.1 g/dL (ref 6.0–8.3)

## 2011-10-23 NOTE — Progress Notes (Signed)
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  We held day 8 cycle 1 due to symptomatic hypotension and held day 15 cycle 2 due to ANC 1.0. Cycle 3 began 10-17-11. She prefers not to be treated week of 10-31-11 as her only grandchild's third birthday will be that week in [place].  Patient presented with postmenopausal bleeding to Dr.Diana Thomasena Edis and was referred to Dr.Elizabeth Skinner. She had stage 1 grade B2 endometrioid adenocarcinoma at surgery11-07-2009,which was robotic hysterectomy/BSO/staging. She had adjuvant brachytherapy by Dr.Kinard 3000cGy in 5 fractions. She did well until she was found to have a metastatic nodule in the anterior vaginal wall in fall 2012, with no evidence of other involvement. She was treated with external beam radiation therapy 02-20-11 thru 03-27-11 then intracavitary brachytherapy x4 thru 05-02-11 by Dr Roselind Messier. She had initial good shrinkage of the tumor nodule after radiation, then in April 2013 was found to have new ulcerations in distal vagina which were biopsy proven to be progressive disease. Repeat CT AP had no evidence of other metastatic disease, tho reportedly had some changes consistent with radiation therapy. As surgery would likely require anterior exoneration with urinary diversion, recommendation was for systemic chemotherapy using carboplatin at AUC=5 day 1 and taxol 80 mg/m2 days 04-24-13 every 28 days.  She is to see Dr Clifton James next on 11-30-11 and Dr Roselind Messier 11-27-11.  Patient had no vaginal bleeding at all yesterday, but has had light spotting again today. She was fatigued x 2 days after treatment 7-2, and had aching in legs moreso than with previous taxol. She did not try anything for the taxol aches and is aware that she can use Motrin and/or tylenol if that is  helpful. She also had muscle spasm left mid back, improved with flexeril by Dr Earl Gala. She denies peripheral neuropathy and has had no nausea. Hair is thinning. We will decrease premedication decadron to 16 mg 12 hrs prior to taxol.  Objective:  Vital signs in last 24 hours:  BP 160/80  Pulse 100  Temp 97.4 F (36.3 C) (Oral)  Ht 5\' 2"  (1.575 m)  Wt 163 lb (73.936 kg)  BMI 29.81 kg/m2 Weight is up 1.5 lbs. Easily ambulatory, looks comfortable. No complete alopecia.   HEENT:mucous membranes moist, pharynx normal without lesions. PERRL LymphaticsCervical, supraclavicular, and axillary nodes normal.No inguinal adenopathy Resp: clear to auscultation bilaterally and normal percussion bilaterally Cardio: regular rate and rhythm GI: soft, non-tender; bowel sounds normal; no masses,  no organomegaly Extremities: extremities normal, atraumatic, no cyanosis or edema Neuro:no sensory deficits noted Skin without rash or ecchymoses Portacath: steristrips still on since placement 10-03-11, and bandaid still on since last week. Steristrips removed, incision completely healed. Irritated area at inferior portion of port from adhesive, patient instructed to use topical antibiotic bid x 2-3 days. No erythema or tenderness otherwise.  Lab Results:   [facility] 10/23/11 1208  WBC 3.3*  HGB 12.2  HCT 35.7  PLT 184  ANC 2.2 This Hgb improved from 11.6 last  BMET  Basename 10/23/11 1208  NA 138  K 4.2  CL 104  CO2 25  GLUCOSE 116*  BUN 15  CREATININE 0.99  CALCIUM 9.4   Remainder of CMET WNL Studies/Results:  No results found.  Medications: I have reviewed the patient's current medications. Decrease premed decadron as above.  Assessment/Plan: 1.Endometrial carcinoma recurrent in  vagina following surgery and RT: chemotherapy in process. She will have treatment 10-24-11, but will hold day 15 on 7-16 for grandson's birthday. She will resume treatment July 23 (taxol only) and have [place] +  taxol July 30 if counts adequate. 2.PAC in. Some irritation from adhesive, plan as above. 3.no further lightheadedness since off antihypertensives. BPs here variable, from 116/76 to present 160/80 4.osteoporosis, nephrolithiasis, past strep fasciitis    LIVESAY,LENNIS P, MD   10/23/2011, 8:23 PM

## 2011-10-23 NOTE — Patient Instructions (Signed)
Aches after chemo likely from the taxol. Try taking motrin shortly before you noticed the aches last time -- so ~ lunch time on Thursday after taxol on Tuesday. You can use the motrin every 6 hrs with food if it helps. Tylenol is ok to try also, and heat may help.  Decrease decadron (dexamethasone, steroid) to 4 tablets (= 16 mg) 12 hrs before taxol.

## 2011-10-23 NOTE — Telephone Encounter (Signed)
Per staff message I have scheduled appts. JMW  

## 2011-10-23 NOTE — Telephone Encounter (Signed)
Gave patient appointment for 11-07-2011 11-14-2011 11-20-2011 11-21-2011 gave patient instruction to please come by scheduling on 10-24-2011 to  Pick up her completed calendar

## 2011-10-24 ENCOUNTER — Encounter: Payer: Medicare Other | Admitting: Nutrition

## 2011-10-24 ENCOUNTER — Ambulatory Visit (HOSPITAL_BASED_OUTPATIENT_CLINIC_OR_DEPARTMENT_OTHER): Payer: Medicare Other

## 2011-10-24 ENCOUNTER — Ambulatory Visit: Payer: Medicare Other | Admitting: Nutrition

## 2011-10-24 VITALS — BP 158/82 | HR 92 | Temp 97.4°F

## 2011-10-24 DIAGNOSIS — C549 Malignant neoplasm of corpus uteri, unspecified: Secondary | ICD-10-CM

## 2011-10-24 DIAGNOSIS — C541 Malignant neoplasm of endometrium: Secondary | ICD-10-CM

## 2011-10-24 DIAGNOSIS — Z5111 Encounter for antineoplastic chemotherapy: Secondary | ICD-10-CM

## 2011-10-24 MED ORDER — DIPHENHYDRAMINE HCL 50 MG/ML IJ SOLN
25.0000 mg | Freq: Once | INTRAMUSCULAR | Status: AC
  Administered 2011-10-24: 25 mg via INTRAVENOUS

## 2011-10-24 MED ORDER — SODIUM CHLORIDE 0.9 % IV SOLN
Freq: Once | INTRAVENOUS | Status: AC
  Administered 2011-10-24: 09:00:00 via INTRAVENOUS

## 2011-10-24 MED ORDER — SODIUM CHLORIDE 0.9 % IJ SOLN
10.0000 mL | INTRAMUSCULAR | Status: DC | PRN
  Administered 2011-10-24: 10 mL
  Filled 2011-10-24: qty 10

## 2011-10-24 MED ORDER — PACLITAXEL CHEMO INJECTION 300 MG/50ML
80.0000 mg/m2 | Freq: Once | INTRAVENOUS | Status: AC
  Administered 2011-10-24: 144 mg via INTRAVENOUS
  Filled 2011-10-24: qty 24

## 2011-10-24 MED ORDER — DEXAMETHASONE SODIUM PHOSPHATE 4 MG/ML IJ SOLN
20.0000 mg | Freq: Once | INTRAMUSCULAR | Status: AC
  Administered 2011-10-24: 20 mg via INTRAVENOUS

## 2011-10-24 MED ORDER — ONDANSETRON 16 MG/50ML IVPB (CHCC)
16.0000 mg | Freq: Once | INTRAVENOUS | Status: AC
  Administered 2011-10-24: 16 mg via INTRAVENOUS

## 2011-10-24 MED ORDER — FAMOTIDINE IN NACL 20-0.9 MG/50ML-% IV SOLN
20.0000 mg | Freq: Once | INTRAVENOUS | Status: AC
  Administered 2011-10-24: 20 mg via INTRAVENOUS

## 2011-10-24 MED ORDER — HEPARIN SOD (PORK) LOCK FLUSH 100 UNIT/ML IV SOLN
500.0000 [IU] | Freq: Once | INTRAVENOUS | Status: AC | PRN
  Administered 2011-10-24: 500 [IU]
  Filled 2011-10-24: qty 5

## 2011-10-24 MED ORDER — SODIUM CHLORIDE 0.9 % IV SOLN
385.0000 mg | Freq: Once | INTRAVENOUS | Status: AC
  Administered 2011-10-24: 390 mg via INTRAVENOUS
  Filled 2011-10-24: qty 39

## 2011-10-24 NOTE — Progress Notes (Signed)
Ms. Kennerson was receiving chemotherapy today.  She reports nausea has been controlled with her medications.  She denies constipation.  Her weight has increased to 163 pounds on July 8th from 160.6 pounds June 17.  She continues to have somewhat of a poor appetite and difficulty eating several days after chemotherapy.  She continues to eat a diet higher in fruits and vegetables, and she thinks that she had been eating more often.  NUTRITION DIAGNOSIS:  Unintended weight loss has improved.  INTERVENTION:  I have encouraged the patient to continue a healthy plant-based diet with adequate protein to provide weight maintenance.  I have answered her questions today and encouraged her to continue to safety and food hygiene as well as increased fruits, vegetables and lean protein sources.  The patient was appreciative of information.  MONITORING/EVALUATION (GOALS):  The patient has tolerated a healthy diet to promote weight maintenance.  NEXT VISIT:  Tuesday, August 6th during chemotherapy.    ______________________________ Zenovia Jarred, RD, LDN Clinical Nutrition Specialist BN/MEDQ  D:  10/24/2011  T:  10/24/2011  Job:  [id number]

## 2011-10-24 NOTE — Patient Instructions (Addendum)
[facility] Discharge Instructions for Patients Receiving Chemotherapy  Today you received the following chemotherapy agents Taxol and Carboplatin  To help prevent nausea and vomiting after your treatment, we encourage you to take your nausea medication  Begin taking it at 7 pm and take it as often as prescribed for the next 24 to 72 hours.   If you develop nausea and vomiting that is not controlled by your nausea medication, call the clinic. If it is after clinic hours your family physician or the after hours number for the clinic or go to the Emergency Department.   BELOW ARE SYMPTOMS THAT SHOULD BE REPORTED IMMEDIATELY:  *FEVER GREATER THAN 100.5 F  *CHILLS WITH OR WITHOUT FEVER  NAUSEA AND VOMITING THAT IS NOT CONTROLLED WITH YOUR NAUSEA MEDICATION  *UNUSUAL SHORTNESS OF BREATH  *UNUSUAL BRUISING OR BLEEDING  TENDERNESS IN MOUTH AND THROAT WITH OR WITHOUT PRESENCE OF ULCERS  *URINARY PROBLEMS  *BOWEL PROBLEMS  UNUSUAL RASH Items with * indicate a potential emergency and should be followed up as soon as possible.  One of the nurses will contact you 24 hours after your treatment. Please let the nurse know about any problems that you may have experienced. Feel free to call the clinic you have any questions or concerns. The clinic phone number is [phone].   I have been informed and understand all the instructions given to me. I know to contact the clinic, my physician, or go to the Emergency Department if any problems should occur. I do not have any questions at this time, but understand that I may call the clinic during office hours   should I have any questions or need assistance in obtaining follow up care.    __________________________________________  _____________  __________ Signature of Patient or Authorized Representative            Date                   Time    __________________________________________ Nurse's Signature    

## 2011-10-25 ENCOUNTER — Other Ambulatory Visit: Payer: Medicare Other | Admitting: Lab

## 2011-10-25 ENCOUNTER — Ambulatory Visit: Payer: Medicare Other | Admitting: Oncology

## 2011-11-06 ENCOUNTER — Ambulatory Visit: Payer: Medicare Other

## 2011-11-07 ENCOUNTER — Other Ambulatory Visit (HOSPITAL_BASED_OUTPATIENT_CLINIC_OR_DEPARTMENT_OTHER): Payer: Medicare Other | Admitting: Lab

## 2011-11-07 ENCOUNTER — Ambulatory Visit (HOSPITAL_BASED_OUTPATIENT_CLINIC_OR_DEPARTMENT_OTHER): Payer: Medicare Other

## 2011-11-07 VITALS — BP 161/83 | HR 80 | Temp 97.8°F

## 2011-11-07 DIAGNOSIS — C549 Malignant neoplasm of corpus uteri, unspecified: Secondary | ICD-10-CM

## 2011-11-07 DIAGNOSIS — C541 Malignant neoplasm of endometrium: Secondary | ICD-10-CM

## 2011-11-07 DIAGNOSIS — Z5111 Encounter for antineoplastic chemotherapy: Secondary | ICD-10-CM

## 2011-11-07 LAB — COMPREHENSIVE METABOLIC PANEL
ALT: 16 U/L (ref 0–35)
AST: 18 U/L (ref 0–37)
Alkaline Phosphatase: 59 U/L (ref 39–117)
Sodium: 140 mEq/L (ref 135–145)
Total Bilirubin: 0.3 mg/dL (ref 0.3–1.2)
Total Protein: 6.5 g/dL (ref 6.0–8.3)

## 2011-11-07 LAB — CBC WITH DIFFERENTIAL/PLATELET
BASO%: 0 % (ref 0.0–2.0)
EOS%: 0 % (ref 0.0–7.0)
LYMPH%: 13.8 % — ABNORMAL LOW (ref 14.0–49.7)
MCHC: 34.4 g/dL (ref 31.5–36.0)
MCV: 97.6 fL (ref 79.5–101.0)
MONO#: 0.1 10*3/uL (ref 0.1–0.9)
MONO%: 2.2 % (ref 0.0–14.0)
Platelets: 193 10*3/uL (ref 145–400)
RBC: 3.39 10*6/uL — ABNORMAL LOW (ref 3.70–5.45)
WBC: 3.2 10*3/uL — ABNORMAL LOW (ref 3.9–10.3)

## 2011-11-07 MED ORDER — PACLITAXEL CHEMO INJECTION 300 MG/50ML
80.0000 mg/m2 | Freq: Once | INTRAVENOUS | Status: AC
  Administered 2011-11-07: 144 mg via INTRAVENOUS
  Filled 2011-11-07: qty 24

## 2011-11-07 MED ORDER — DIPHENHYDRAMINE HCL 50 MG/ML IJ SOLN
25.0000 mg | Freq: Once | INTRAMUSCULAR | Status: AC
  Administered 2011-11-07: 25 mg via INTRAVENOUS

## 2011-11-07 MED ORDER — HEPARIN SOD (PORK) LOCK FLUSH 100 UNIT/ML IV SOLN
500.0000 [IU] | Freq: Once | INTRAVENOUS | Status: AC | PRN
  Administered 2011-11-07: 500 [IU]
  Filled 2011-11-07: qty 5

## 2011-11-07 MED ORDER — FAMOTIDINE IN NACL 20-0.9 MG/50ML-% IV SOLN
20.0000 mg | Freq: Once | INTRAVENOUS | Status: AC
  Administered 2011-11-07: 20 mg via INTRAVENOUS

## 2011-11-07 MED ORDER — SODIUM CHLORIDE 0.9 % IJ SOLN
10.0000 mL | INTRAMUSCULAR | Status: DC | PRN
  Administered 2011-11-07: 10 mL
  Filled 2011-11-07: qty 10

## 2011-11-07 MED ORDER — SODIUM CHLORIDE 0.9 % IV SOLN
Freq: Once | INTRAVENOUS | Status: AC
  Administered 2011-11-07: 09:00:00 via INTRAVENOUS

## 2011-11-07 MED ORDER — DEXAMETHASONE SODIUM PHOSPHATE 4 MG/ML IJ SOLN
20.0000 mg | Freq: Once | INTRAMUSCULAR | Status: AC
  Administered 2011-11-07: 20 mg via INTRAVENOUS

## 2011-11-07 MED ORDER — ONDANSETRON 16 MG/50ML IVPB (CHCC)
16.0000 mg | Freq: Once | INTRAVENOUS | Status: AC
  Administered 2011-11-07: 16 mg via INTRAVENOUS

## 2011-11-07 NOTE — Patient Instructions (Addendum)
[facility] Discharge Instructions for Patients Receiving Chemotherapy  Today you received the following chemotherapy agents Taxol  To help prevent nausea and vomiting after your treatment, we encourage you to take your nausea medication as prescribed.  If you develop nausea and vomiting that is not controlled by your nausea medication, call the clinic. If it is after clinic hours your family physician or the after hours number for the clinic or go to the Emergency Department.   BELOW ARE SYMPTOMS THAT SHOULD BE REPORTED IMMEDIATELY:  *FEVER GREATER THAN 100.5 F  *CHILLS WITH OR WITHOUT FEVER  NAUSEA AND VOMITING THAT IS NOT CONTROLLED WITH YOUR NAUSEA MEDICATION  *UNUSUAL SHORTNESS OF BREATH  *UNUSUAL BRUISING OR BLEEDING  TENDERNESS IN MOUTH AND THROAT WITH OR WITHOUT PRESENCE OF ULCERS  *URINARY PROBLEMS  *BOWEL PROBLEMS  UNUSUAL RASH Items with * indicate a potential emergency and should be followed up as soon as possible.  One of the nurses will contact you 24 hours after your treatment. Please let the nurse know about any problems that you may have experienced. Feel free to call the clinic you have any questions or concerns. The clinic phone number is [phone].   I have been informed and understand all the instructions given to me. I know to contact the clinic, my physician, or go to the Emergency Department if any problems should occur. I do not have any questions at this time, but understand that I may call the clinic during office hours   should I have any questions or need assistance in obtaining follow up care.    __________________________________________  _____________  __________ Signature of Patient or Authorized Representative            Date                   Time    __________________________________________ Nurse's Signature    

## 2011-11-08 ENCOUNTER — Other Ambulatory Visit: Payer: Self-pay

## 2011-11-08 DIAGNOSIS — C541 Malignant neoplasm of endometrium: Secondary | ICD-10-CM

## 2011-11-08 MED ORDER — DEXAMETHASONE 4 MG PO TABS: ORAL_TABLET | ORAL | Status: DC

## 2011-11-13 ENCOUNTER — Ambulatory Visit: Payer: Medicare Other

## 2011-11-14 ENCOUNTER — Other Ambulatory Visit (HOSPITAL_BASED_OUTPATIENT_CLINIC_OR_DEPARTMENT_OTHER): Payer: Medicare Other | Admitting: Lab

## 2011-11-14 ENCOUNTER — Ambulatory Visit (HOSPITAL_BASED_OUTPATIENT_CLINIC_OR_DEPARTMENT_OTHER): Payer: Medicare Other

## 2011-11-14 VITALS — BP 149/87 | HR 91 | Temp 98.2°F

## 2011-11-14 DIAGNOSIS — Z5111 Encounter for antineoplastic chemotherapy: Secondary | ICD-10-CM

## 2011-11-14 DIAGNOSIS — C549 Malignant neoplasm of corpus uteri, unspecified: Secondary | ICD-10-CM

## 2011-11-14 DIAGNOSIS — C541 Malignant neoplasm of endometrium: Secondary | ICD-10-CM

## 2011-11-14 LAB — CBC WITH DIFFERENTIAL/PLATELET
BASO%: 0 % (ref 0.0–2.0)
LYMPH%: 10.5 % — ABNORMAL LOW (ref 14.0–49.7)
MCHC: 34.4 g/dL (ref 31.5–36.0)
MCV: 97.7 fL (ref 79.5–101.0)
MONO%: 1.3 % (ref 0.0–14.0)
NEUT%: 88.2 % — ABNORMAL HIGH (ref 38.4–76.8)
Platelets: 174 10*3/uL (ref 145–400)
RBC: 3.42 10*6/uL — ABNORMAL LOW (ref 3.70–5.45)
nRBC: 0 % (ref 0–0)

## 2011-11-14 MED ORDER — HEPARIN SOD (PORK) LOCK FLUSH 100 UNIT/ML IV SOLN
500.0000 [IU] | Freq: Once | INTRAVENOUS | Status: AC | PRN
  Administered 2011-11-14: 500 [IU]
  Filled 2011-11-14: qty 5

## 2011-11-14 MED ORDER — SODIUM CHLORIDE 0.9 % IV SOLN
Freq: Once | INTRAVENOUS | Status: AC
  Administered 2011-11-14: 09:00:00 via INTRAVENOUS

## 2011-11-14 MED ORDER — ONDANSETRON 16 MG/50ML IVPB (CHCC)
16.0000 mg | Freq: Once | INTRAVENOUS | Status: AC
  Administered 2011-11-14: 16 mg via INTRAVENOUS

## 2011-11-14 MED ORDER — DIPHENHYDRAMINE HCL 50 MG/ML IJ SOLN
25.0000 mg | Freq: Once | INTRAMUSCULAR | Status: AC
  Administered 2011-11-14: 25 mg via INTRAVENOUS

## 2011-11-14 MED ORDER — PACLITAXEL CHEMO INJECTION 300 MG/50ML
80.0000 mg/m2 | Freq: Once | INTRAVENOUS | Status: AC
  Administered 2011-11-14: 144 mg via INTRAVENOUS
  Filled 2011-11-14: qty 24

## 2011-11-14 MED ORDER — FAMOTIDINE IN NACL 20-0.9 MG/50ML-% IV SOLN
20.0000 mg | Freq: Once | INTRAVENOUS | Status: AC
  Administered 2011-11-14: 20 mg via INTRAVENOUS

## 2011-11-14 MED ORDER — DEXAMETHASONE SODIUM PHOSPHATE 4 MG/ML IJ SOLN
20.0000 mg | Freq: Once | INTRAMUSCULAR | Status: AC
Start: 2011-11-14 — End: 2011-11-14
  Administered 2011-11-14: 20 mg via INTRAVENOUS

## 2011-11-14 MED ORDER — SODIUM CHLORIDE 0.9 % IJ SOLN
10.0000 mL | INTRAMUSCULAR | Status: DC | PRN
  Administered 2011-11-14: 10 mL
  Filled 2011-11-14: qty 10

## 2011-11-14 MED ORDER — SODIUM CHLORIDE 0.9 % IV SOLN
385.0000 mg | Freq: Once | INTRAVENOUS | Status: AC
  Administered 2011-11-14: 390 mg via INTRAVENOUS
  Filled 2011-11-14: qty 39

## 2011-11-14 NOTE — Patient Instructions (Addendum)
[facility] Discharge Instructions for Patients Receiving Chemotherapy  Today you received the following chemotherapy agents taxol anc carboplatin  To help prevent nausea and vomiting after your treatment, we encourage you to take your nausea medication zofran Begin taking it tomorrow and take it as often as prescribed for the next 48 hours.   If you develop nausea and vomiting that is not controlled by your nausea medication, call the clinic. If it is after clinic hours your family physician or the after hours number for the clinic or go to the Emergency Department.   BELOW ARE SYMPTOMS THAT SHOULD BE REPORTED IMMEDIATELY:  *FEVER GREATER THAN 100.5 F  *CHILLS WITH OR WITHOUT FEVER  NAUSEA AND VOMITING THAT IS NOT CONTROLLED WITH YOUR NAUSEA MEDICATION  *UNUSUAL SHORTNESS OF BREATH  *UNUSUAL BRUISING OR BLEEDING  TENDERNESS IN MOUTH AND THROAT WITH OR WITHOUT PRESENCE OF ULCERS  *URINARY PROBLEMS  *BOWEL PROBLEMS  UNUSUAL RASH Items with * indicate a potential emergency and should be followed up as soon as possible.   . Feel free to call the clinic you have any questions or concerns. The clinic phone number is [phone].   I have been informed and understand all the instructions given to me. I know to contact the clinic, my physician, or go to the Emergency Department if any problems should occur. I do not have any questions at this time, but understand that I may call the clinic during office hours   should I have any questions or need assistance in obtaining follow up care.    __________________________________________  _____________  __________ Signature of Patient or Authorized Representative            Date                   Time    __________________________________________ Nurse's Signature

## 2011-11-15 ENCOUNTER — Ambulatory Visit
Admission: RE | Admit: 2011-11-15 | Discharge: 2011-11-15 | Disposition: A | Discharge status: Home / Self Care | Payer: Medicare Other | Source: Ambulatory Visit | Attending: Internal Medicine | Admitting: Internal Medicine

## 2011-11-15 DIAGNOSIS — Z1231 Encounter for screening mammogram for malignant neoplasm of breast: Secondary | ICD-10-CM

## 2011-11-19 ENCOUNTER — Other Ambulatory Visit: Payer: Self-pay | Admitting: Oncology

## 2011-11-20 ENCOUNTER — Ambulatory Visit (HOSPITAL_BASED_OUTPATIENT_CLINIC_OR_DEPARTMENT_OTHER): Payer: Medicare Other | Admitting: Lab

## 2011-11-20 ENCOUNTER — Ambulatory Visit (HOSPITAL_BASED_OUTPATIENT_CLINIC_OR_DEPARTMENT_OTHER): Payer: Medicare Other | Admitting: Oncology

## 2011-11-20 VITALS — BP 165/88 | HR 97 | Temp 97.4°F | Resp 20 | Ht 62.0 in | Wt 164.4 lb

## 2011-11-20 DIAGNOSIS — C549 Malignant neoplasm of corpus uteri, unspecified: Secondary | ICD-10-CM

## 2011-11-20 DIAGNOSIS — C541 Malignant neoplasm of endometrium: Secondary | ICD-10-CM

## 2011-11-20 DIAGNOSIS — M810 Age-related osteoporosis without current pathological fracture: Secondary | ICD-10-CM

## 2011-11-20 DIAGNOSIS — F411 Generalized anxiety disorder: Secondary | ICD-10-CM

## 2011-11-20 DIAGNOSIS — C7982 Secondary malignant neoplasm of genital organs: Secondary | ICD-10-CM

## 2011-11-20 LAB — CBC WITH DIFFERENTIAL/PLATELET
BASO%: 0.8 % (ref 0.0–2.0)
Basophils Absolute: 0 10*3/uL (ref 0.0–0.1)
Eosinophils Absolute: 0.1 10*3/uL (ref 0.0–0.5)
HCT: 31.4 % — ABNORMAL LOW (ref 34.8–46.6)
HGB: 11 g/dL — ABNORMAL LOW (ref 11.6–15.9)
LYMPH%: 29.4 % (ref 14.0–49.7)
MONO#: 0.2 10*3/uL (ref 0.1–0.9)
NEUT#: 1.5 10*3/uL (ref 1.5–6.5)
NEUT%: 59.6 % (ref 38.4–76.8)
Platelets: 148 10*3/uL (ref 145–400)
WBC: 2.6 10*3/uL — ABNORMAL LOW (ref 3.9–10.3)
lymph#: 0.8 10*3/uL — ABNORMAL LOW (ref 0.9–3.3)

## 2011-11-20 NOTE — Patient Instructions (Signed)
Dr Clifton James 11-30-11 as scheduled. Dr Darrold Span will schedule next appointment as appropriate after Dr Clifton James gives recommendatiions

## 2011-11-21 ENCOUNTER — Encounter: Payer: Medicare Other | Admitting: Nutrition

## 2011-11-21 ENCOUNTER — Ambulatory Visit: Payer: Medicare Other

## 2011-11-21 ENCOUNTER — Other Ambulatory Visit: Payer: Medicare Other | Admitting: Lab

## 2011-11-21 ENCOUNTER — Encounter: Payer: Self-pay | Admitting: Oncology

## 2011-11-21 NOTE — Progress Notes (Signed)
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 Day 1 cycle 1 was 08-29-11 and she is scheduled for day 15 cycle 4 tomorrow. She has missed 2 taxol treatments (due to hypotension and low counts) thru this course. The vaginal spotting improved initially tho did not resolve entirely, however she reports increased bleeding in the last week such that she had to use several pads daily.  Otherwise she has tolerated the chemotherapy fairly well, tho she notices more fatigue now. She has had no peripheral neuropathy symptoms and no nausea, tho she does have taste disturbance.  Peripheral IV access was not adequate and she now has PAC. Bowels have been moving adequately. She has had no fever or symptoms of infection, no shortness of breath or cough, no LE swelling, no pain. Remainder of 10 point Review of Systems negative.   Patient had initially presented with postmenopausal bleeding to Dr.Diana Thomasena Edis and was referred to Dr.Elizabeth Skinner. She had stage 1 grade B2 endometrioid adenocarcinoma at surgery 02-18-2010, which was robotic hysterectomy/BSO/staging with bladder suspension. She had adjuvant brachytherapy by Dr.Kinard 3000cGy in 5 fractions. She did well until she was found to have a metastatic nodule in the anterior vaginal wall in fall 2012, with no evidence of other involvement. She was treated with external beam radiation therapy 02-20-11 thru 03-27-11 then intracavitary brachytherapy x4 thru 05-02-11 by Dr Roselind Messier. She had initial good shrinkage of the tumor nodule after radiation, then in April 2013 was found to have new ulcerations in distal vagina which were biopsy proven to be progressive disease. Repeat CT AP had no evidence of other metastatic disease, tho  reportedly had some changes consistent with radiation therapy. As surgery would likely require anterior exoneration with urinary diversion, recommendation was for systemic chemotherapy using carboplatin at AUC=5 day 1 and taxol 80 mg/m2 days 04-24-13 every 28 days. Chemotherapy has now been given from 08-29-11 thru 11-14-11. Patient is scheduled to see Dr Clifton James in [place] office on 11-30-11.  Patient's sister in law died during the week that she was off treatment, and she was glad that she was able to be with family then.  Objective:  Vital signs in last 24 hours:  BP 165/88  Pulse 97  Temp 97.4 F (36.3 C) (Oral)  Resp 20  Ht 5\' 2"  (1.575 m)  Wt 164 lb 6.4 oz (74.571 kg)  BMI 30.07 kg/m2 Weight is up 1.5 lbs. Ambulatory without assistance, looks comfortable. She does not have complete alopecia  HEENT:PERRLA, sclera clear, anicteric and oropharynx clear, no lesions LymphaticsCervical, supraclavicular, and axillary nodes normal. Resp: clear to auscultation bilaterally and normal percussion bilaterally Cardio: regular rate and rhythm GI: soft, non-tender; bowel sounds normal; no masses,  no organomegaly Extremities: extremities normal, atraumatic, no cyanosis or edema Neuro:no sensory deficits noted Pelvic exam shows small blood in vagina, with areas of nodular tumor into lower vagina. Portacath-without erythema, minimal resolving ecchymosis from placement  Lab Results:  Results for orders placed in visit on 11/07/11  CBC WITH DIFFERENTIAL      Component Value Range   WBC 3.2 (*) 3.9 - 10.3 10e3/uL   NEUT# 2.7  1.5 - 6.5 10e3/uL   HGB 11.4 (*) 11.6 - 15.9 g/dL   HCT 40.9 (*) 81.1 - 91.4 %   Platelets 193  145 -  400 10e3/uL   MCV 97.6  79.5 - 101.0 fL   MCH 33.6  25.1 - 34.0 pg   MCHC 34.4  31.5 - 36.0 g/dL   RBC 6.43 (*) 3.29 - 5.18 10e6/uL   RDW 15.3 (*) 11.2 - 14.5 %   lymph# 0.4 (*) 0.9 - 3.3 10e3/uL   MONO# 0.1  0.1 - 0.9 10e3/uL   Eosinophils Absolute 0.0  0.0 - 0.5  10e3/uL   Basophils Absolute 0.0  0.0 - 0.1 10e3/uL   NEUT% 84.0 (*) 38.4 - 76.8 %   LYMPH% 13.8 (*) 14.0 - 49.7 %   MONO% 2.2  0.0 - 14.0 %   EOS% 0.0  0.0 - 7.0 %   BASO% 0.0  0.0 - 2.0 %  COMPREHENSIVE METABOLIC PANEL      Component Value Range   Sodium 140  135 - 145 mEq/L   Potassium 4.4  3.5 - 5.3 mEq/L   Chloride 105  96 - 112 mEq/L   CO2 22  19 - 32 mEq/L   Glucose, Bld 225 (*) 70 - 99 mg/dL   BUN 11  6 - 23 mg/dL   Creatinine, Ser 8.41  0.50 - 1.10 mg/dL   Total Bilirubin 0.3  0.3 - 1.2 mg/dL   Alkaline Phosphatase 59  39 - 117 U/L   AST 18  0 - 37 U/L   ALT 16  0 - 35 U/L   Total Protein 6.5  6.0 - 8.3 g/dL   Albumin 4.1  3.5 - 5.2 g/dL   Calcium 8.8  8.4 - 66.0 mg/dL  CBC WITH DIFFERENTIAL      Component Value Range   WBC 4.6  3.9 - 10.3 10e3/uL   NEUT# 4.0  1.5 - 6.5 10e3/uL   HGB 11.5 (*) 11.6 - 15.9 g/dL   HCT 63.0 (*) 16.0 - 10.9 %   Platelets 174  145 - 400 10e3/uL   MCV 97.7  79.5 - 101.0 fL   MCH 33.6  25.1 - 34.0 pg   MCHC 34.4  31.5 - 36.0 g/dL   RBC 3.23 (*) 5.57 - 3.22 10e6/uL   RDW 15.2 (*) 11.2 - 14.5 %   lymph# 0.5 (*) 0.9 - 3.3 10e3/uL   MONO# 0.1  0.1 - 0.9 10e3/uL   Eosinophils Absolute 0.0  0.0 - 0.5 10e3/uL   Basophils Absolute 0.0  0.0 - 0.1 10e3/uL   NEUT% 88.2 (*) 38.4 - 76.8 %   LYMPH% 10.5 (*) 14.0 - 49.7 %   MONO% 1.3  0.0 - 14.0 %   EOS% 0.0  0.0 - 7.0 %   BASO% 0.0  0.0 - 2.0 %   nRBC 0  0 - 0 %     Studies/Results: Last CTs in my records were with [facility] 08-03-11. I have not ordered repeat scans now, as Dr Clifton James will probably prefer these at same location.  Medications: I have reviewed the patient's current medications.  I have told Mrs Zecca that I am concerned that this chemotherapy is not helping as much as we had hoped, and that I do not think treating tomorrow will benefit. I have left message for Dr Clifton James with her office now.   Assessment/Plan: 1.Endometrial carcinoma: history as above, recurrent to vagina. She  is now having increased vaginal bleeding from tumor despite carboplatin and taxol as above. I will let Dr Clifton James know situation prior to her upcoming appointment, and I am certainly glad to continue to assist in  patient's care in [place] if needed. 2.PAC in. This was last flushed with treatment 11-14-11 and will need to be flushed every 6-8 weeks if not used otherwise. 3.off antihypertensives since low blood pressure early in chemotherapy course. BP some higher today, tho she is also anxious 4.osteoporosis, nephrolithiasis, past strep fasciitis  I have not made another appointment at this office, including for PAC flush, until we hear from Dr Clifton James.  LIVESAY,LENNIS P, MD   11/21/2011, 3:01 PM

## 2011-11-22 ENCOUNTER — Telehealth: Payer: Self-pay

## 2011-11-22 ENCOUNTER — Telehealth: Payer: Self-pay | Admitting: Oncology

## 2011-11-22 NOTE — Telephone Encounter (Signed)
Told [name] that [place] PA. for Dr. Clifton James will call her with an appt. in [place] office  for Monday as Dr. Clifton James will not be in the [place] office on 11-23-11 or  11-30-11.

## 2011-11-22 NOTE — Telephone Encounter (Signed)
Called Dr Gayla Medicus office [phone], with return phone call from PA [facility] as Dr Clifton James in [place]. They are aware that chemo was held this week due to still significant vaginal involvement and bleeding. Patient will likely be seen next week in [facility] by Dr Clifton James, that office to call her.  Pamela Mcgill, MD

## 2011-11-23 ENCOUNTER — Telehealth: Payer: Self-pay | Admitting: Oncology

## 2011-11-23 NOTE — Telephone Encounter (Signed)
Per 8/5 pof 8/6 tx to be cx'd and pt aware. No other orders.

## 2011-11-27 ENCOUNTER — Ambulatory Visit: Payer: Medicare Other | Admitting: Radiation Oncology

## 2011-11-27 ENCOUNTER — Telehealth: Payer: Self-pay

## 2011-11-27 NOTE — Telephone Encounter (Signed)
Spoke with receptionist at Dr. Gibson Ramp office and she stated that the 11-20-11 office note from Dr. Darrold Span  was in patient's chart for visit at 1430 today.

## 2011-12-11 ENCOUNTER — Ambulatory Visit: Payer: Medicare Other | Admitting: Radiation Oncology

## 2011-12-11 ENCOUNTER — Encounter: Payer: Self-pay | Admitting: Oncology

## 2011-12-11 NOTE — Progress Notes (Signed)
Phone note from switchboard and RN 12-07-11: per PA with Dr [name] [phone]), patient's disease has progressed and she is going for pelvic exoneration. Apparently focal disease by CT and procedure will be at [facility] by Dr Duard Brady (?). Notes to be faxed to this office from Dr Clifton James.  Ila Mcgill, MD

## 2012-01-15 ENCOUNTER — Telehealth: Payer: Self-pay

## 2012-01-15 NOTE — Telephone Encounter (Signed)
Dr. Clifton James would like Dr. Darrold Span to pager her tomorrow at [phone] to discuss the plan of care.

## 2012-01-16 ENCOUNTER — Telehealth: Payer: Self-pay | Admitting: Oncology

## 2012-01-16 NOTE — Telephone Encounter (Signed)
Paged Dr Clifton James [phone]) per her request from 01-15-12. Patient was re-evaluated by Dr Clifton James and appeared to have localized disease so was sent to [facility] for pelvic exenteration, but repeat PET there had single + lung nodule. She went to wedge resection of the lung nodule, + metastatic endometrial. Patient understands not curable but does want to try more systemic treatment, which she prefers to do in [place]. Dr Clifton James would consider doxil if available + avastin if can get approved, or single agent avastin if cannot use doxil. This office will look into availability of doxil/ coverage for avastin and set up visit with patient. Dr Clifton James will see her at least monthly when back on Rx.  Ila Mcgill, MD

## 2012-01-17 ENCOUNTER — Encounter: Payer: Self-pay | Admitting: Oncology

## 2012-01-17 NOTE — Progress Notes (Addendum)
I called [facility] to see if we should enroll the patient for the Avastin because it is not FDA approved for endometrial cancer. Per my contact there she said we should have her complete the paperwork for Avastin and they will let us know if it will be covered  and if she would qualify for drug replacement if the insurance doesn't cover it.  Doxil is FDA approved for her diagnosis.    01/24/12 Completed paperwork for Avastin and faxed it in to [employer].  01/26/12 She does qualify for drug replacement if her insurance does not pay.  Her patient ID is [id number] and the Case ID is [id number]. I informed Dr. Darrold Span and Wille Celeste.

## 2012-01-19 ENCOUNTER — Other Ambulatory Visit: Payer: Self-pay | Admitting: Oncology

## 2012-01-19 ENCOUNTER — Telehealth: Payer: Self-pay

## 2012-01-19 ENCOUNTER — Telehealth: Payer: Self-pay | Admitting: Oncology

## 2012-01-19 DIAGNOSIS — C541 Malignant neoplasm of endometrium: Secondary | ICD-10-CM

## 2012-01-19 NOTE — Telephone Encounter (Signed)
Talked to patient gave her appt for October 7th chemo class then lab and MD after a few days

## 2012-01-19 NOTE — Telephone Encounter (Signed)
Told Ms. Panameno that the chemo drug Doxil is having manufacturing issues and is in short supply nationally, and that Dr. Clifton James and Dr. Darrold Span have discussed this.  Dr. Darrold Span is working on approval for the Avastin drug for her.  The teaching will be on [place] doxil and avastin before she sees Dr. Darrold Span on 01-24-12.  Dr. Darrold Span may have to adjust which drugs depending on approval and availability of Doxil.  Pt. Verbalized understanding and appreciated the call.

## 2012-01-22 ENCOUNTER — Other Ambulatory Visit: Payer: Medicare Other

## 2012-01-24 ENCOUNTER — Ambulatory Visit (HOSPITAL_BASED_OUTPATIENT_CLINIC_OR_DEPARTMENT_OTHER): Payer: Medicare Other | Admitting: Oncology

## 2012-01-24 ENCOUNTER — Encounter: Payer: Self-pay | Admitting: Oncology

## 2012-01-24 ENCOUNTER — Telehealth: Payer: Self-pay | Admitting: *Deleted

## 2012-01-24 ENCOUNTER — Telehealth: Payer: Self-pay | Admitting: Oncology

## 2012-01-24 ENCOUNTER — Other Ambulatory Visit (HOSPITAL_BASED_OUTPATIENT_CLINIC_OR_DEPARTMENT_OTHER): Payer: Medicare Other | Admitting: Lab

## 2012-01-24 ENCOUNTER — Telehealth: Payer: Self-pay

## 2012-01-24 ENCOUNTER — Ambulatory Visit: Payer: Medicare Other | Admitting: Lab

## 2012-01-24 VITALS — BP 161/76 | HR 72 | Temp 97.0°F | Resp 18 | Ht 62.0 in | Wt 159.0 lb

## 2012-01-24 DIAGNOSIS — C549 Malignant neoplasm of corpus uteri, unspecified: Secondary | ICD-10-CM

## 2012-01-24 DIAGNOSIS — N390 Urinary tract infection, site not specified: Secondary | ICD-10-CM

## 2012-01-24 DIAGNOSIS — C541 Malignant neoplasm of endometrium: Secondary | ICD-10-CM

## 2012-01-24 DIAGNOSIS — C7982 Secondary malignant neoplasm of genital organs: Secondary | ICD-10-CM

## 2012-01-24 DIAGNOSIS — Z23 Encounter for immunization: Secondary | ICD-10-CM

## 2012-01-24 DIAGNOSIS — M899 Disorder of bone, unspecified: Secondary | ICD-10-CM

## 2012-01-24 LAB — URINALYSIS, MICROSCOPIC - CHCC
Bilirubin (Urine): NEGATIVE
Ketones: NEGATIVE mg/dL
Specific Gravity, Urine: 1.015 (ref 1.003–1.035)

## 2012-01-24 LAB — COMPREHENSIVE METABOLIC PANEL (CC13)
Alkaline Phosphatase: 66 U/L (ref 40–150)
BUN: 10 mg/dL (ref 7.0–26.0)
CO2: 23 mEq/L (ref 22–29)
Creatinine: 0.7 mg/dL (ref 0.6–1.1)
Glucose: 89 mg/dl (ref 70–99)
Sodium: 140 mEq/L (ref 136–145)
Total Bilirubin: 0.3 mg/dL (ref 0.20–1.20)

## 2012-01-24 LAB — CBC WITH DIFFERENTIAL/PLATELET
Basophils Absolute: 0 10*3/uL (ref 0.0–0.1)
Eosinophils Absolute: 0.2 10*3/uL (ref 0.0–0.5)
HCT: 34.6 % — ABNORMAL LOW (ref 34.8–46.6)
LYMPH%: 12.2 % — ABNORMAL LOW (ref 14.0–49.7)
MCV: 104.9 fL — ABNORMAL HIGH (ref 79.5–101.0)
MONO%: 12.4 % (ref 0.0–14.0)
NEUT#: 3.8 10*3/uL (ref 1.5–6.5)
NEUT%: 71.4 % (ref 38.4–76.8)
Platelets: 218 10*3/uL (ref 145–400)
RBC: 3.29 10*6/uL — ABNORMAL LOW (ref 3.70–5.45)

## 2012-01-24 LAB — UA PROTEIN, DIPSTICK - CHCC: Protein, ur: <30 mg/dL

## 2012-01-24 MED ORDER — INFLUENZA VIRUS VACC SPLIT PF IM SUSP
0.5000 mL | INTRAMUSCULAR | Status: AC
  Administered 2012-01-24: 0.5 mL via INTRAMUSCULAR
  Filled 2012-01-24: qty 0.5

## 2012-01-24 NOTE — Telephone Encounter (Signed)
appts made and printed for pt  °

## 2012-01-24 NOTE — Patient Instructions (Signed)
Our office will contact you to let you know if we can use Avastin. You are tentatively set up for first treatment next week.  Increase protein in diet.

## 2012-01-24 NOTE — Progress Notes (Signed)
OFFICE PROGRESS NOTE   01/24/2012   Physicians:J.Osborne, E.Skinner, J.Kinard, K.Supple, M.Johnson, P.Gehrig, Merrie Roof [facility] thoracic surgery)   INTERVAL HISTORY:  Patient is seen, alone for visit, at request of Dr Clifton James for consideration of further systemic treatment for metastatic endometrial carcinoma which was progressive in vaginal area on taxol/carboplatin and now also involving lung.   Patient had initially presented with postmenopausal bleeding to Dr.Diana Thomasena Edis and was referred to Dr.Elizabeth Skinner. She had stage 1 grade B2 endometrioid adenocarcinoma at surgery 02-18-2010, which was robotic hysterectomy/BSO/pelvic and right periaortic lymphadnectomy with bladder suspension. She had adjuvant brachytherapy by Dr.Kinard 3000cGy in 5 fractions. She did well until she was found to have a metastatic nodule in the anterior vaginal wall in fall 2012, with no evidence of other involvement. She was treated with external beam radiation therapy 02-20-11 thru 03-27-11 then intracavitary brachytherapy x4 thru 05-02-11 by Dr Roselind Messier. She had initial good shrinkage of the tumor nodule after radiation, then in April 2013 was found to have new ulcerations in distal vagina which were biopsy proven to be progressive disease. Repeat CT AP had no evidence of other metastatic disease; she was treated with carboplatin and weekly taxol from 08-29-11 thru 11-14-11, initially with improvement in vaginal bleeding then progression on treatment. She had scans by Dr Clifton James without evidence of distant metastatic disease and was referred to [facility] gyn oncology for anterior exenteration. Repeat CT/PET at [facility] on 12-15-11 reportedly showed a 1 cm FDG avid RUL pulmonary nodule and non-FDG avid 1.4 cm paratracheal and 1.6 cm periaortic nodes. She went to right VATS with upper lobe wedge resection at [facility] by Dr Merrie Roof on 01-03-12. Pathology from [facility] [phone]) had multifocal metastatic adenocarcinoma consistent with endometrial  primary with clear margins and  negative level 7 and level 4R lymph nodes. Post operative course was remarkable for significant pain and O2 desaturations from patient's history. She was discharged home with O2, which she no longer needs. She had CXR with visit to Dr Merrie Roof on 01-16-12, reportedly was not remarkable otherwise; she has no further follow up planned at [facility] with gyn oncology or thoracic surgery. Dr Clifton James has recommended treatment with doxil + avastin if we can obtain those drugs for her (presently national shortage of doxil and avastin not yet FDA approved for endometrial cancer); patient prefers this treatment in [place].  Patient is having increased vaginal bleeding, now requiring 3 thick pads in 24 hours, previously just panty liners. She notices very bothersome odor with bleeding. Pain from the VATS procedure is better, tho still some discomfort right posterior and lateral chest radiating beneath right breast. She has some difficulty emptying bladder and is up to void 2-3 times overnight. She has had no fever. She denies shortness of breath now and is walking a mile daily for past week. She is eating regular diet and bowels are moving. She has no other new or different pain and no other bleeding. She burned fingers on right hand cooking. Remainder of 10 point Review of Systems negative.  Objective:  Vital signs in last 24 hours:  BP 161/76  Pulse 72  Temp 97 F (36.1 C) (Oral)  Resp 18  Ht 5\' 2"  (1.575 m)  Wt 159 lb (72.122 kg)  BMI 29.08 kg/m2 Weight is down ~ 5.5 lbs from early August. Easily ambulatory, NAD, respirations not labored RA, very pleasant as always.    HEENT:PERRLA, sclera clear, anicteric, oropharynx clear, no lesions and neck supple with midline trachea LymphaticsCervical, supraclavicular,  and axillary nodes normal. Resp: clear to percussion and breath sounds heard bilateraly without wheeze or crackles Back/ chest wall with posterior VATS incision and  lateral drain site closed without erythema or unexpected tenderness and n swelling. Cardio: regular rate and rhythm GI: soft, non-tender; bowel sounds normal; no masses,  no organomegaly Extremities: extremities normal, atraumatic, no cyanosis or edema Intact blisters of 2 fingers right hand without evidence of infection Neuro:no sensory deficits noted Portacath without erythema or tenderness  Lab Results:  Results for orders placed in visit on 01/24/12  CBC WITH DIFFERENTIAL      Component Value Range   WBC 5.4  3.9 - 10.3 10e3/uL   NEUT# 3.8  1.5 - 6.5 10e3/uL   HGB 11.8  11.6 - 15.9 g/dL   HCT 09.8 (*) 11.9 - 14.7 %   Platelets 218  145 - 400 10e3/uL   MCV 104.9 (*) 79.5 - 101.0 fL   MCH 35.7 (*) 25.1 - 34.0 pg   MCHC 34.0  31.5 - 36.0 g/dL   RBC 8.29 (*) 5.62 - 1.30 10e6/uL   RDW 13.4  11.2 - 14.5 %   lymph# 0.7 (*) 0.9 - 3.3 10e3/uL   MONO# 0.7  0.1 - 0.9 10e3/uL   Eosinophils Absolute 0.2  0.0 - 0.5 10e3/uL   Basophils Absolute 0.0  0.0 - 0.1 10e3/uL   NEUT% 71.4  38.4 - 76.8 %   LYMPH% 12.2 (*) 14.0 - 49.7 %   MONO% 12.4  0.0 - 14.0 %   EOS% 3.6  0.0 - 7.0 %   BASO% 0.4  0.0 - 2.0 %  COMPREHENSIVE METABOLIC PANEL (CC13)      Component Value Range   Sodium 140  136 - 145 mEq/L   Potassium 4.0  3.5 - 5.1 mEq/L   Chloride 106  98 - 107 mEq/L   CO2 23  22 - 29 mEq/L   Glucose 89  70 - 99 mg/dl   BUN 86.5  7.0 - 78.4 mg/dL   Creatinine 0.7  0.6 - 1.1 mg/dL   Total Bilirubin 6.96  0.20 - 1.20 mg/dL   Alkaline Phosphatase 66  40 - 150 U/L   AST 17  5 - 34 U/L   ALT 16  0 - 55 U/L   Total Protein 6.4  6.4 - 8.3 g/dL   Albumin 3.4 (*) 3.5 - 5.0 g/dL   Calcium 9.2  8.4 - 29.5 mg/dL  UA PROTEIN, DIPSTICK - [facility]      Component Value Range   Protein, ur < 30  Negative- <30 mg/dL     Studies/Results:  No results found.  Medications: I have reviewed the patient's current medications. Patient attended chemotherapy education class for doxil and avastin before this  visit.  Prior to patient's visit, Dr Clifton James and I discussed options for treatment in light of doxil shortage. I have talked with patient now about single agent avastin, including possible side effects and concern about invasive surgical procedures with this drug. Our financial office has submitted avastin request today. I do not want to begin avastin prior to next week due to VATS procedure on 01-03-12. Patient prefers early Tues AMs for treatment. She has PAC in but I do not believe she has had echocardiogram. She has not seen urology. She continues baby ASA daily, which she will be advised to stop with ongoing heavier bleeding and possible avastin.  Assessment/Plan: 1. Metastatic endometrial carcinoma involving anterior vaginal wall from urethra to cuff,  and post resection of single metastatic pulmonary lesion (01-03-12). She is in agreement with further systemic treatment in attempt to palliate and control disease, which she understands is not curable.Plan at completion of visit was to begin avastin next week if coverage possible. Following patient's visit, [facility] pharmacy has unexpectedly obtained a supply of doxil sufficient to begin treatment with this. We will be back in touch with patient about this, as she will need echocardiogram also. I am more concerned about the urinary symptoms and will also recommend urology evaluation prior to starting avastin in case surgical procedure is needed or strongly anticipated. 2.PAC in 3.HTN and elevated lipids 4. Hx strep fasciitis/ mysitis LE 2009 with debridement 5.history of nephrolithiasis, tho I do not have urologist listed in my initial consult note 6.parathyridectomy, osteopenia 7.burn right hand: no other intervention needed now 8.Flu vaccine given today  At completion of visit, patient had questions answered to her satisfaction and understood that she would hear further from [place] re avastin application, as, at time of visit,we did not expect to be able  to use doxil now.   Michell Kader P, MD   01/24/2012, 10:14 AM

## 2012-01-24 NOTE — Telephone Encounter (Signed)
Per staff phone call and POF I have scheduled appt. JMW  

## 2012-01-24 NOTE — Telephone Encounter (Signed)
Faxed order to [facility] durable medical equiptment dept. to D/C O2.  Notified patient that this was done.

## 2012-01-25 ENCOUNTER — Other Ambulatory Visit: Payer: Self-pay | Admitting: Oncology

## 2012-01-25 ENCOUNTER — Telehealth: Payer: Self-pay | Admitting: *Deleted

## 2012-01-25 DIAGNOSIS — D539 Nutritional anemia, unspecified: Secondary | ICD-10-CM

## 2012-01-25 DIAGNOSIS — C541 Malignant neoplasm of endometrium: Secondary | ICD-10-CM

## 2012-01-25 NOTE — Telephone Encounter (Signed)
Per Dr Darrold Span: Notified patient that Doxil is available and plan to start next week 10/15. Needs to have echo PRIOR to starting Doxil, needs to stop aspirin-pt confirmed that she has already stopped asa. Also told her that Dr Darrold Span wants her to see a urologist-  Pt does not have one that she prefers. Dr Darrold Span to discuss avastin after urology consult. Notified patient that she will receive calls for both of those appointments. Pt verbalized understanding. Told her to call me back if she has any questions.

## 2012-01-25 NOTE — Telephone Encounter (Signed)
Line busy

## 2012-01-26 ENCOUNTER — Telehealth: Payer: Self-pay | Admitting: Oncology

## 2012-01-26 ENCOUNTER — Other Ambulatory Visit: Payer: Self-pay

## 2012-01-26 DIAGNOSIS — N390 Urinary tract infection, site not specified: Secondary | ICD-10-CM

## 2012-01-26 LAB — URINE CULTURE

## 2012-01-26 MED ORDER — CIPROFLOXACIN HCL 250 MG PO TABS: 250.0000 mg | ORAL_TABLET | Freq: Two times a day (BID) | ORAL | Status: DC

## 2012-01-26 NOTE — Progress Notes (Signed)
Spoke with [name] and told her that she has a small UTI.  She she is symptomatic Dr. Darrold Span wants her to take cipro 250 mg bid  For 3 days..  Pt. Will pick up prescription this evening. She understands that Dr. Darrold Span wants her to see a urologist as is being set up.   Pt. Also to begin Doxil Chemotherapy on 01-31-12 as she is having the echo on 01-29-12 and this will give time for results prior to treatment.

## 2012-01-26 NOTE — Telephone Encounter (Signed)
[name] from [facility] called and gv appt for 10/14 @ 1pm to arrive 12:45 pm. Pt to see Dr. Iona Coach. S/w pt she is aware and LL also made aware.

## 2012-01-26 NOTE — Telephone Encounter (Signed)
Fax coversheet sent to HIM to send notes to [facility] for 10/14 appt.

## 2012-01-26 NOTE — Telephone Encounter (Signed)
S/w pt re echo for 10/14. Message to [facility] re precert. Camilla in vas lab aware I will call back w/precert. LL aware of echo d/t and that I am still waiting to hear back from [facility]. S/w triage nurse Gwynn today and per Gwynn it won't be until some time this afternoon that she calls me back w/an appt. Pt aware.

## 2012-01-26 NOTE — Telephone Encounter (Signed)
Per Bonita Quin precert for echo is #[id number]. S/w Cindy in vas lab and gv her precert #.

## 2012-01-29 ENCOUNTER — Telehealth: Payer: Self-pay | Admitting: *Deleted

## 2012-01-29 ENCOUNTER — Ambulatory Visit (HOSPITAL_COMMUNITY)
Admission: RE | Admit: 2012-01-29 | Discharge: 2012-01-29 | Disposition: A | Discharge status: Home / Self Care | Payer: Medicare Other | Source: Ambulatory Visit | Attending: Oncology | Admitting: Oncology

## 2012-01-29 ENCOUNTER — Telehealth: Payer: Self-pay

## 2012-01-29 ENCOUNTER — Other Ambulatory Visit: Payer: Self-pay | Admitting: Oncology

## 2012-01-29 DIAGNOSIS — C3490 Malignant neoplasm of unspecified part of unspecified bronchus or lung: Secondary | ICD-10-CM | POA: Insufficient documentation

## 2012-01-29 DIAGNOSIS — C549 Malignant neoplasm of corpus uteri, unspecified: Secondary | ICD-10-CM | POA: Insufficient documentation

## 2012-01-29 DIAGNOSIS — M009 Pyogenic arthritis, unspecified: Secondary | ICD-10-CM | POA: Insufficient documentation

## 2012-01-29 DIAGNOSIS — I059 Rheumatic mitral valve disease, unspecified: Secondary | ICD-10-CM | POA: Insufficient documentation

## 2012-01-29 DIAGNOSIS — R197 Diarrhea, unspecified: Secondary | ICD-10-CM | POA: Insufficient documentation

## 2012-01-29 DIAGNOSIS — C541 Malignant neoplasm of endometrium: Secondary | ICD-10-CM

## 2012-01-29 DIAGNOSIS — I10 Essential (primary) hypertension: Secondary | ICD-10-CM | POA: Insufficient documentation

## 2012-01-29 NOTE — Telephone Encounter (Signed)
Patient called with Echo results; per Dr. Darrold Span, showed good heart function.  Patient knows about scheduled appointment to receive Doxil on 01/31/2012.  Patient verbalized understanding.

## 2012-01-29 NOTE — Progress Notes (Signed)
*  PRELIMINARY RESULTS* Echocardiogram 2D Echocardiogram has been performed.  [name] 01/29/2012, 10:32 AM

## 2012-01-29 NOTE — Telephone Encounter (Signed)
Faxed records to [facility] for appt. today as requested.

## 2012-01-30 ENCOUNTER — Ambulatory Visit: Payer: Medicare Other

## 2012-01-30 ENCOUNTER — Other Ambulatory Visit: Payer: Medicare Other | Admitting: Lab

## 2012-01-31 ENCOUNTER — Other Ambulatory Visit: Payer: Medicare Other | Admitting: Lab

## 2012-01-31 ENCOUNTER — Ambulatory Visit (HOSPITAL_BASED_OUTPATIENT_CLINIC_OR_DEPARTMENT_OTHER): Payer: Medicare Other

## 2012-01-31 VITALS — BP 127/73 | HR 76 | Temp 97.6°F | Resp 16

## 2012-01-31 DIAGNOSIS — C549 Malignant neoplasm of corpus uteri, unspecified: Secondary | ICD-10-CM

## 2012-01-31 DIAGNOSIS — C541 Malignant neoplasm of endometrium: Secondary | ICD-10-CM

## 2012-01-31 DIAGNOSIS — Z5111 Encounter for antineoplastic chemotherapy: Secondary | ICD-10-CM

## 2012-01-31 MED ORDER — HEPARIN SOD (PORK) LOCK FLUSH 100 UNIT/ML IV SOLN
500.0000 [IU] | Freq: Once | INTRAVENOUS | Status: AC | PRN
  Administered 2012-01-31: 500 [IU]
  Filled 2012-01-31: qty 5

## 2012-01-31 MED ORDER — DEXAMETHASONE SODIUM PHOSPHATE 10 MG/ML IJ SOLN
10.0000 mg | Freq: Once | INTRAMUSCULAR | Status: AC
  Administered 2012-01-31: 10 mg via INTRAVENOUS

## 2012-01-31 MED ORDER — SODIUM CHLORIDE 0.9 % IV SOLN
Freq: Once | INTRAVENOUS | Status: AC
  Administered 2012-01-31: 09:00:00 via INTRAVENOUS

## 2012-01-31 MED ORDER — DOXORUBICIN HCL LIPOSOMAL CHEMO INJECTION 2 MG/ML
40.0000 mg/m2 | Freq: Once | INTRAVENOUS | Status: AC
  Administered 2012-01-31: 72 mg via INTRAVENOUS
  Filled 2012-01-31: qty 36

## 2012-01-31 MED ORDER — ONDANSETRON 8 MG/50ML IVPB (CHCC)
8.0000 mg | Freq: Once | INTRAVENOUS | Status: AC
  Administered 2012-01-31: 8 mg via INTRAVENOUS

## 2012-01-31 MED ORDER — SODIUM CHLORIDE 0.9 % IJ SOLN
10.0000 mL | INTRAMUSCULAR | Status: DC | PRN
  Administered 2012-01-31: 10 mL
  Filled 2012-01-31: qty 10

## 2012-01-31 NOTE — Patient Instructions (Addendum)
[facility] Discharge Instructions for Patients Receiving Chemotherapy  Today you received the following chemotherapy agents doxil  To help prevent nausea and vomiting after your treatment, we encourage you to take your nausea medication as prescribed by MD Begin taking it at 3pm and take it as often as prescribed for the next 48 hours if needed.   If you develop nausea and vomiting that is not controlled by your nausea medication, call the clinic. If it is after clinic hours your family physician or the after hours number for the clinic or go to the Emergency Department.   BELOW ARE SYMPTOMS THAT SHOULD BE REPORTED IMMEDIATELY:  *FEVER GREATER THAN 100.5 F  *CHILLS WITH OR WITHOUT FEVER  NAUSEA AND VOMITING THAT IS NOT CONTROLLED WITH YOUR NAUSEA MEDICATION  *UNUSUAL SHORTNESS OF BREATH  *UNUSUAL BRUISING OR BLEEDING  TENDERNESS IN MOUTH AND THROAT WITH OR WITHOUT PRESENCE OF ULCERS  *URINARY PROBLEMS  *BOWEL PROBLEMS  UNUSUAL RASH Items with * indicate a potential emergency and should be followed up as soon as possible.  One of the nurses will contact you 24 hours after your treatment. Please let the nurse know about any problems that you may have experienced. Feel free to call the clinic you have any questions or concerns. The clinic phone number is [phone].   I have been informed and understand all the instructions given to me. I know to contact the clinic, my physician, or go to the Emergency Department if any problems should occur. I do not have any questions at this time, but understand that I may call the clinic during office hours   should I have any questions or need assistance in obtaining follow up care.    __________________________________________  _____________  __________ Signature of Patient or Authorized Representative            Date                   Time    __________________________________________ Nurse's Signature

## 2012-02-01 ENCOUNTER — Telehealth: Payer: Self-pay | Admitting: *Deleted

## 2012-02-01 NOTE — Telephone Encounter (Signed)
Called and spoke to patient for chemo follow up. Denies any nausea/vomiting/diarrhea. Urine did appear darker yesterday eve, but has returned to normal today. Encouraged increasing fluid intake. Patient says the only issue she noticed was more difficulty sleeping last night, which may be contributed to steroids with pre-meds in treatment. If patient continues to have issues, she understands to call us back.

## 2012-02-07 ENCOUNTER — Encounter: Payer: Self-pay | Admitting: Oncology

## 2012-02-07 ENCOUNTER — Ambulatory Visit (HOSPITAL_BASED_OUTPATIENT_CLINIC_OR_DEPARTMENT_OTHER): Payer: Medicare Other | Admitting: Oncology

## 2012-02-07 ENCOUNTER — Other Ambulatory Visit (HOSPITAL_BASED_OUTPATIENT_CLINIC_OR_DEPARTMENT_OTHER): Payer: Medicare Other | Admitting: Lab

## 2012-02-07 ENCOUNTER — Telehealth: Payer: Self-pay | Admitting: Oncology

## 2012-02-07 ENCOUNTER — Telehealth: Payer: Self-pay

## 2012-02-07 ENCOUNTER — Other Ambulatory Visit: Payer: Self-pay

## 2012-02-07 VITALS — BP 131/76 | HR 69 | Temp 97.5°F | Resp 20 | Ht 62.0 in | Wt 158.0 lb

## 2012-02-07 DIAGNOSIS — D539 Nutritional anemia, unspecified: Secondary | ICD-10-CM

## 2012-02-07 DIAGNOSIS — C541 Malignant neoplasm of endometrium: Secondary | ICD-10-CM

## 2012-02-07 DIAGNOSIS — C7982 Secondary malignant neoplasm of genital organs: Secondary | ICD-10-CM

## 2012-02-07 DIAGNOSIS — C549 Malignant neoplasm of corpus uteri, unspecified: Secondary | ICD-10-CM

## 2012-02-07 DIAGNOSIS — M810 Age-related osteoporosis without current pathological fracture: Secondary | ICD-10-CM

## 2012-02-07 LAB — COMPREHENSIVE METABOLIC PANEL (CC13)
ALT: 14 U/L (ref 0–55)
AST: 13 U/L (ref 5–34)
Chloride: 102 mEq/L (ref 98–107)
Creatinine: 0.8 mg/dL (ref 0.6–1.1)
Sodium: 138 mEq/L (ref 136–145)
Total Bilirubin: 0.3 mg/dL (ref 0.20–1.20)
Total Protein: 6.5 g/dL (ref 6.4–8.3)

## 2012-02-07 LAB — CBC WITH DIFFERENTIAL/PLATELET
BASO%: 0.3 % (ref 0.0–2.0)
EOS%: 3.6 % (ref 0.0–7.0)
HCT: 36.6 % (ref 34.8–46.6)
LYMPH%: 13 % — ABNORMAL LOW (ref 14.0–49.7)
MCH: 35 pg — ABNORMAL HIGH (ref 25.1–34.0)
MCHC: 33.7 g/dL (ref 31.5–36.0)
NEUT%: 72.3 % (ref 38.4–76.8)
RBC: 3.53 10*6/uL — ABNORMAL LOW (ref 3.70–5.45)
lymph#: 0.7 10*3/uL — ABNORMAL LOW (ref 0.9–3.3)

## 2012-02-07 MED ORDER — LORAZEPAM 0.5 MG PO TABS: 0.5000 mg | ORAL_TABLET | Freq: Four times a day (QID) | ORAL | Status: AC | PRN

## 2012-02-07 NOTE — Progress Notes (Signed)
OFFICE PROGRESS NOTE   02/07/2012   Physicians: J.Osborne, E.Skinner, J.Kinard, K.Supple, M.Johnson, P.Gehrig, Merrie Roof [facility] thoracic surgery), S.MacDiarmid   INTERVAL HISTORY:  Patient is seen, alone for visit, now having begun doxil for endometrial carcinoma metastatic to vagina and lung. This first doxil was given 01-31-12, after our pharmacy unexpectedly received a shipment of the drug. Our office has also gotten coverage arranged for avastin, however this was not begun with the doxil in order to allow urology evaluation first.  Patient was seen by Dr Alfredo Martinez with cystoscopy on 01-29-12, with no tumor involvement of urethra or bladder including trigone and no extrinsic compression of urethra. He recommended Rapaflo, which has seemed helpful for poor flow symptoms at night. She has follow up with Dr Sherron Monday on 11-1--13.  Patient had initially presented with postmenopausal bleeding and was referred to Dr.Elizabeth Skinner. She had stage 1 grade B2 endometrioid adenocarcinoma at surgery 02-18-2010, which was robotic hysterectomy/BSO/pelvic and right periaortic lymphadnectomy with bladder suspension. She had adjuvant brachytherapy by Dr.Kinard 3000cGy in 5 fractions. She did well until she was found to have a metastatic nodule in the anterior vaginal wall in fall 2012, with no evidence of other involvement. She was treated with external beam radiation therapy 02-20-11 thru 03-27-11 then intracavitary brachytherapy x4 thru 05-02-11 by Dr Roselind Messier. She had initial good shrinkage of the tumor nodule after radiation, then in April 2013 was found to have new ulcerations in distal vagina which were biopsy proven to be progressive disease. Repeat CT AP had no evidence of other metastatic disease; she was treated with carboplatin and weekly taxol from 08-29-11 thru 11-14-11, initially with improvement in vaginal bleeding then progression on treatment. She had scans by Dr Clifton James without evidence of  distant metastatic disease and was referred to [facility] gyn oncology for anterior exenteration. Repeat CT/PET at [facility] on 12-15-11 reportedly showed a 1 cm FDG avid RUL pulmonary nodule and non-FDG avid 1.4 cm paratracheal and 1.6 cm periaortic nodes. She went to right VATS with upper lobe wedge resection at [facility] by Dr Merrie Roof on 01-03-12. Pathology from [facility] [phone]) had multifocal metastatic adenocarcinoma consistent with endometrial primary with clear margins and negative level 7 and level 4R lymph nodes. Post operative course was remarkable for significant pain and O2 desaturations from patient's history.  She no longer needs home O2. She had CXR with visit to Dr Merrie Roof on 01-16-12, reportedly was not remarkable otherwise; she has no further follow up planned at [facility] with gyn oncology or thoracic surgery. Dr Clifton James recommended treatment with doxil + avastin if possible; we presently do have doxil despite supply problems and have gotten financial clearance for avastin. Dr Clifton James plans to examine patient monthly during this treatment.   Mrs Carley was very fatigued and had nausea x 3 days after doxil, some improvement with zofran and still was able to drink adequate fluids tho ate very little. She no longer has nausea and energy is improved. She was careful about her skin, with no irritation this first cycle. PAC functioned well. The vaginal bleeding seems unchanged, using ~ 3 regular pads in 24 hours. Breathing is fine and she no longer has discomfort from right chest VATS. She has had no fever or symptoms of infection. Bowels are moving regularly. She has no abdominal or pelvic discomfort. Remainder of 10 point Review of Systems negative.  Patient plans to go to the beach with friends week of Nov 4. Objective:  Vital signs in last 24 hours:  BP 131/76  Pulse 69  Temp 97.5 F (36.4 C) (Oral)  Resp 20  Ht 5\' 2"  (1.575 m)  Wt 158 lb (71.668 kg)  BMI 28.90 kg/m2 Weight is down 1 lb. Easily  ambulatory, looks comfortable, respirations not labored RA. Hair thin but no complete alopecia  HEENT:PERRLA, sclera clear, anicteric, oropharynx clear, no lesions and neck supple with midline trachea LymphaticsCervical, supraclavicular, and axillary nodes normal. No infuinal adenopathy Resp: clear to auscultation bilaterally and normal percussion bilaterally. VATS incisions closed, not tender, resolving discoloration. One stitch visible at top of posterior incision. Cardio: regular rate and rhythm GI: soft, non-tender; bowel sounds normal; no masses,  no organomegaly Vaginal introitus not remarkable and no bleeding apparent now. Extremities: extremities normal, atraumatic, no cyanosis or edema Neuro:no sensory deficits noted Portacath no tenderness or swelling. Slight erythema at skin across access area with no other findings, consistent with slight doxil skin irritation there.  Lab Results:  Results for orders placed in visit on 02/07/12  CBC WITH DIFFERENTIAL      Component Value Range   WBC 5.7  3.9 - 10.3 10e3/uL   NEUT# 4.1  1.5 - 6.5 10e3/uL   HGB 12.3  11.6 - 15.9 g/dL   HCT 40.9  81.1 - 91.4 %   Platelets 219  145 - 400 10e3/uL   MCV 103.7 (*) 79.5 - 101.0 fL   MCH 35.0 (*) 25.1 - 34.0 pg   MCHC 33.7  31.5 - 36.0 g/dL   RBC 7.82 (*) 9.56 - 2.13 10e6/uL   RDW 13.1  11.2 - 14.5 %   lymph# 0.7 (*) 0.9 - 3.3 10e3/uL   MONO# 0.6  0.1 - 0.9 10e3/uL   Eosinophils Absolute 0.2  0.0 - 0.5 10e3/uL   Basophils Absolute 0.0  0.0 - 0.1 10e3/uL   NEUT% 72.3  38.4 - 76.8 %   LYMPH% 13.0 (*) 14.0 - 49.7 %   MONO% 10.8  0.0 - 14.0 %   EOS% 3.6  0.0 - 7.0 %   BASO% 0.3  0.0 - 2.0 %  COMPREHENSIVE METABOLIC PANEL (CC13)      Component Value Range   Sodium 138  136 - 145 mEq/L   Potassium 4.4  3.5 - 5.1 mEq/L   Chloride 102  98 - 107 mEq/L   CO2 23  22 - 29 mEq/L   Glucose 70  70 - 99 mg/dl   BUN 08.6  7.0 - 57.8 mg/dL   Creatinine 0.8  0.6 - 1.1 mg/dL   Total Bilirubin 4.69  0.20 -  1.20 mg/dL   Alkaline Phosphatase 70  40 - 150 U/L   AST 13  5 - 34 U/L   ALT 14  0 - 55 U/L   Total Protein 6.5  6.4 - 8.3 g/dL   Albumin 3.5  3.5 - 5.0 g/dL   Calcium 9.4  8.4 - 62.9 mg/dL    No drop in counts thus far, day 7 cycle 1 doxil. We will recheck CBC on 02-16-12 (day 17) or sooner if problems.  B12 and folate pending today with persistently elevated MCV, tho this may still be related to previous chemo  Studies/Results:  No results found.  Medications: I have reviewed the patient's current medications.  Assessment/Plan: 1. Metastatic endometrial carcinoma: progressive in vagina post RT and despite carbo/taxol thru 11-14-11; solitary lung met resected, but not candidate for pelvic exenteration due to this. Now has had first doxil and we expect to add avastin with  second doxil 02-28-12. Will recheck CBC 11-1, exam by Dr Clifton James, then I will see her 02-26-12 prior to 11-13 treatment. 2.PAC in 3. No urologic findings that would contraindicate avastin 4. Hx HTN still off lisinopril 5. hx osteoporosis, strep fasciitis, nephrolithiasis 6. Has had flu vaccine   Patient was comfortable with discussion and plan.  LIVESAY,LENNIS P, MD   02/07/2012, 10:13 AM

## 2012-02-07 NOTE — Patient Instructions (Addendum)
You should see Dr Clifton James before next treatment on Nov 13 (maybe during week of Nov 4)  We will check your blood counts again on Nov 1. Call before then if you are very tired, or increased bleeding, or fever/ symptoms of infection

## 2012-02-07 NOTE — Telephone Encounter (Signed)
gv pt appt schedule for November and lmonvm for [facility] @ Dr. Rushie Nyhan office [phone]) requesting appt for wk of 10/31 per note sent to scheduling by LL w/pt. Dr. Marko Stai office asked to call pt w/appt. Pt will follow up with Dr. Gibson Ramp office if she doesn't hear from them soon. Also pt arrived @ scheduling w/a note from LL stating that dates on pof (10/23) were not exactly correct and gv instruction re what appt dates should be. The pof was not corrected/changed to reflect the instructions on LL's note.

## 2012-02-07 NOTE — Telephone Encounter (Signed)
Marchelle Folks from Dr. Gibson Ramp office returned my call re appt for pt. Per Marchelle Folks she will call pt to schedule but Dr. Clifton James will not be in the [place] office on 10/31. Also per Marchelle Folks Dr. Clifton James would like to know if pt has had a round of chemo and what is her status now. Message forwarded to desk nurse. lmonvm informing Marchelle Folks and asking that she call me if notes are required.

## 2012-02-07 NOTE — Telephone Encounter (Signed)
Lm for Marchelle Folks stating that Ms. Febo has received one course of Doxil on 01-31-12.  Faxed office note from today to [facility] for follow up appt. In [place] on 11-4 or 11-7.  Fax: [phone].

## 2012-02-07 NOTE — Telephone Encounter (Signed)
Marchelle Folks called back and lmonvm w/fax # [phone] to send notes to. Completed fax cover sheet sent to HIM to sent notes to Dr. Gibson Ramp office.  Phone [phone] Fax [phone] gyn dept.

## 2012-02-08 ENCOUNTER — Other Ambulatory Visit: Payer: Self-pay | Admitting: *Deleted

## 2012-02-08 NOTE — Telephone Encounter (Signed)
confirming order for ativan

## 2012-02-16 ENCOUNTER — Inpatient Hospital Stay (HOSPITAL_COMMUNITY)
Admission: EM | Admit: 2012-02-16 | Discharge: 2012-02-17 | Disposition: A | Discharge status: Home / Self Care | Payer: Medicare Other | Attending: Obstetrics & Gynecology | Admitting: Obstetrics & Gynecology

## 2012-02-16 ENCOUNTER — Encounter (HOSPITAL_COMMUNITY): Payer: Self-pay | Admitting: Emergency Medicine

## 2012-02-16 ENCOUNTER — Telehealth: Payer: Self-pay

## 2012-02-16 ENCOUNTER — Other Ambulatory Visit (HOSPITAL_BASED_OUTPATIENT_CLINIC_OR_DEPARTMENT_OTHER): Payer: Medicare Other | Admitting: Lab

## 2012-02-16 DIAGNOSIS — N898 Other specified noninflammatory disorders of vagina: Principal | ICD-10-CM | POA: Insufficient documentation

## 2012-02-16 DIAGNOSIS — C549 Malignant neoplasm of corpus uteri, unspecified: Secondary | ICD-10-CM

## 2012-02-16 DIAGNOSIS — N939 Abnormal uterine and vaginal bleeding, unspecified: Secondary | ICD-10-CM | POA: Diagnosis present

## 2012-02-16 DIAGNOSIS — C541 Malignant neoplasm of endometrium: Secondary | ICD-10-CM

## 2012-02-16 DIAGNOSIS — Z8742 Personal history of other diseases of the female genital tract: Secondary | ICD-10-CM

## 2012-02-16 LAB — CBC WITH DIFFERENTIAL/PLATELET
Basophils Absolute: 0 10*3/uL (ref 0.0–0.1)
Eosinophils Absolute: 0.1 10*3/uL (ref 0.0–0.5)
HGB: 12 g/dL (ref 11.6–15.9)
LYMPH%: 19.5 % (ref 14.0–49.7)
MCV: 102.6 fL — ABNORMAL HIGH (ref 79.5–101.0)
MONO%: 10.6 % (ref 0.0–14.0)
NEUT#: 3.3 10*3/uL (ref 1.5–6.5)
NEUT%: 67.4 % (ref 38.4–76.8)
Platelets: 200 10*3/uL (ref 145–400)

## 2012-02-16 NOTE — Telephone Encounter (Signed)
Told Ms. Lickteig that her counts looked great and is should go and have a good time at the beach next week.  Keep appt. as scheduled on 02-26-12.

## 2012-02-16 NOTE — ED Notes (Signed)
Pt alert, arrives from home, c/o bright red rectal bleeding, onset chronic in nature, worse this evening, states hx of uterine cancer, instructed to come to ED per Oncology, resp even unlabored, skin pwd

## 2012-02-16 NOTE — ED Notes (Addendum)
Patient states she has a history of endometrial cancer. Reports vaginal bleeding ongoing became increasingly heavy tonight. Called oncologist and was notified to come to ED. Patient reports soaking 6 feminine pads this evening.

## 2012-02-17 ENCOUNTER — Encounter (HOSPITAL_COMMUNITY): Payer: Self-pay | Admitting: *Deleted

## 2012-02-17 DIAGNOSIS — Z8742 Personal history of other diseases of the female genital tract: Secondary | ICD-10-CM

## 2012-02-17 DIAGNOSIS — N939 Abnormal uterine and vaginal bleeding, unspecified: Secondary | ICD-10-CM | POA: Diagnosis present

## 2012-02-17 DIAGNOSIS — N898 Other specified noninflammatory disorders of vagina: Principal | ICD-10-CM

## 2012-02-17 DIAGNOSIS — C549 Malignant neoplasm of corpus uteri, unspecified: Secondary | ICD-10-CM

## 2012-02-17 LAB — CBC
MCH: 33.5 pg (ref 26.0–34.0)
MCHC: 33.4 g/dL (ref 30.0–36.0)
MCV: 100.3 fL — ABNORMAL HIGH (ref 78.0–100.0)
Platelets: 196 10*3/uL (ref 150–400)
RDW: 12.4 % (ref 11.5–15.5)

## 2012-02-17 LAB — COMPREHENSIVE METABOLIC PANEL
AST: 17 U/L (ref 0–37)
Albumin: 3.1 g/dL — ABNORMAL LOW (ref 3.5–5.2)
Calcium: 8.5 mg/dL (ref 8.4–10.5)
Creatinine, Ser: 0.71 mg/dL (ref 0.50–1.10)

## 2012-02-17 LAB — PROTIME-INR: INR: 0.97 (ref 0.00–1.49)

## 2012-02-17 MED ORDER — SODIUM CHLORIDE 0.9 % IV BOLUS (SEPSIS)
1000.0000 mL | Freq: Once | INTRAVENOUS | Status: AC
  Administered 2012-02-17: 1000 mL via INTRAVENOUS

## 2012-02-17 MED ORDER — HEPARIN SOD (PORK) LOCK FLUSH 100 UNIT/ML IV SOLN
500.0000 [IU] | INTRAVENOUS | Status: AC | PRN
  Administered 2012-02-17: 500 [IU]

## 2012-02-17 MED ORDER — HEPARIN SOD (PORK) LOCK FLUSH 100 UNIT/ML IV SOLN: 500.0000 [IU] | INTRAVENOUS | Status: DC | PRN

## 2012-02-17 NOTE — MAU Provider Note (Signed)
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  I spoke with her Gyn Onc group, Dr Marlane Hatcher, at [facility] and he is supportive of me evaluating treating and managing her issue tonight.  She is in palliative management at this point according to patient.  On exam she has no active bleeding or clots.  i placed Monsel's solution with good effect on the vaginal erosion lesion.  No packing needed.  CSN: [id number]  Arrival date and time: 02/16/12 2240   First Provider Initiated Contact with Patient 02/17/12 [id number]      Chief Complaint  Patient presents with  . Vaginal Bleeding   HPI    Past Medical History  Diagnosis Date  . Endometrial adenocarcinoma 9/22/211    dx  endometrium bx  . Hypertension   . Hypercholesterolemia   . Chronic kidney disease kidney stones,cystocele  . Cataract forming,no surgery as yet  . Arthritis     osteopenia  . Hx of radiation therapy 06/21/2010-07/18/2010    proximal vagina  . Anxiety   . Vaginal bleeding   . Liver cyst     benign  . History of cervical polypectomy     endocervical polypectomy 5 years ago    Past Surgical History  Procedure Date  . Parathyroid adenoma   . Abdominal hysterectomy total  w/ b/l salpingoopherectomy  . Breast reduction surgery   . Infected skin debridement     debridex3 r thigh,myositis     No family history on file.  History  Substance Use Topics  . Smoking status: Never Smoker   . Smokeless tobacco: Never Used  . Alcohol Use: Not on file    Allergies:  Allergies  Allergen Reactions  . Voltaren (Diclofenac Sodium)     Swelling/rash    Prescriptions prior to admission  Medication Sig Dispense Refill  . ALPRAZolam (XANAX) 0.5 MG tablet Take 0.5 mg by mouth Twice daily as needed.      Marland Kitchen aspirin 81 MG tablet Take 81 mg by mouth daily.        Marland Kitchen BIOTIN PO Take  1 tablet by mouth daily. For hair,skin,nails       . Calcium Carbonate-Vitamin D (CALCIUM + D PO) Take 500 mg by mouth daily.      . cholecalciferol (VITAMIN D) 1000 UNITS tablet Take 1,000 Units by mouth daily.        . fish oil-omega-3 fatty acids 1000 MG capsule Take 1,000 mg by mouth daily.        . Glucosamine-Chondroitin (GLUCOSAMINE CHONDR COMPLEX PO) Take 1 tablet by mouth daily.      . hydrocortisone (ANUSOL-HC) 25 MG suppository Place 25 mg rectally 2 (two) times daily as needed. New med 1 supp pr bid x 5 days,2 refills       . lidocaine-prilocaine (EMLA) cream Apply to Portacath  2 hours prior to access.  30 g  1  . LORazepam (ATIVAN) 0.5 MG tablet Take 1 tablet (0.5 mg total) by mouth every 6 (six) hours as needed for anxiety. TAKE 1 TAB UNDER THE TONGUE OR SWALLOW EVERY 6 HOURS AS NEEDED FOR NAUSEA. WILL MAKE DROWSY  30 tablet  0  . Multiple Vitamins-Minerals (MULTIVITAMIN WITH MINERALS) tablet Take 1 tablet by mouth daily. Ca-iron-minerals daily vit for women       . ondansetron (ZOFRAN) 8 MG tablet Take  1-2 tablets (8-16 mg total) by mouth every 12 (twelve) hours as needed for nausea. WILL NOT MAKE YOU DROWSY.  30 tablet  0  . rosuvastatin (CRESTOR) 5 MG tablet Take 5 mg by mouth daily.        . silodosin (RAPAFLO) 4 MG CAPS capsule Take 8 mg by mouth daily with breakfast.        ROS As per hpi Physical Exam   Blood pressure 147/87, pulse 79, temperature 98.1 F (36.7 C), temperature source Oral, resp. rate 16, SpO2 97.00%.  Physical Exam As per hpi  MAU Course  Procedures monsel's placement   Assessment and Plan  1.  Widely metastatic endometrial cancer with vaginal mets and bleeding, not currently active  Patient wants to go to [facility] this weekend and i have encouraged her to do so.  I placed Monsel's to stabilize for now.  EURE,LUTHER H 02/17/2012, 6:30 AM

## 2012-02-17 NOTE — H&P (Addendum)
[facility] History and Physical  [name] AOZ:[id number] DOB: [birth year]    PCP:   Darnelle Bos, MD   Chief Complaint: vaginal bleeding.  HPI: [name] is an 78 y.o. female with hx of metastatic uterine ca, getting care from ONC-GYN at [facility], and hematolgy here, Hx of CKD, Hypercholesterolemia, HTN, presents to [facility] ER with vaginal bleeding.  She had spotting for over 6 months, but tonight had significant bleeding.  Evaluation in the ER included Hb of 12. With good hemodynamics.  Hem was consulted by EDP and recommended serial H/H.  GYN was consulted with Dr Glenford Peers and [facility], then she will be admitted to [facility].  Hospitalist was asked to admit her.  Rewiew of Systems:  Constitutional: Negative for malaise, fever and chills. No significant weight loss or weight gain Eyes: Negative for eye pain, redness and discharge, diplopia, visual changes, or flashes of light. ENMT: Negative for ear pain, hoarseness, nasal congestion, sinus pressure and sore throat. No headaches; tinnitus, drooling, or problem swallowing. Cardiovascular: Negative for chest pain, palpitations, diaphoresis, dyspnea and peripheral edema. ; No orthopnea, PND Respiratory: Negative for cough, hemoptysis, wheezing and stridor. No pleuritic chestpain. Gastrointestinal: Negative for nausea, vomiting, diarrhea, constipation, abdominal pain, melena, blood in stool, hematemesis, jaundice and rectal bleeding.    Genitourinary: Negative for frequency, dysuria, incontinence,flank pain and hematuria; Musculoskeletal: Negative for back pain and neck pain. Negative for swelling and trauma.;  Skin: . Negative for pruritus, rash, abrasions, bruising and skin lesion.; ulcerations Neuro: Negative for headache, lightheadedness and neck stiffness. Negative for weakness, altered level of consciousness , altered mental status, extremity weakness, burning feet,  involuntary movement, seizure and syncope.  Psych: negative for anxiety, depression, insomnia, tearfulness, panic attacks, hallucinations, paranoia, suicidal or homicidal ideation    Past Medical History  Diagnosis Date  . Endometrial adenocarcinoma 9/22/211    dx  endometrium bx  . Hypertension   . Hypercholesterolemia   . Chronic kidney disease kidney stones,cystocele  . Cataract forming,no surgery as yet  . Arthritis     osteopenia  . Hx of radiation therapy 06/21/2010-07/18/2010    proximal vagina  . Anxiety   . Vaginal bleeding   . Liver cyst     benign  . History of cervical polypectomy     endocervical polypectomy 5 years ago    Past Surgical History  Procedure Date  . Parathyroid adenoma   . Abdominal hysterectomy total  w/ b/l salpingoopherectomy  . Breast reduction surgery   . Infected skin debridement     debridex3 r thigh,myositis     Medications:  HOME MEDS: Prior to Admission medications   Medication Sig Start Date End Date Taking? Authorizing Provider  ALPRAZolam Prudy Feeler) 0.5 MG tablet Take 0.5 mg by mouth Twice daily as needed. 01/16/12  Yes Historical Provider, MD  aspirin 81 MG tablet Take 81 mg by mouth daily.     Yes Historical Provider, MD  BIOTIN PO Take 1 tablet by mouth daily. For hair,skin,nails    Yes Historical Provider, MD  Calcium Carbonate-Vitamin D (CALCIUM + D PO) Take 500 mg by mouth daily.   Yes Historical Provider, MD  cholecalciferol (VITAMIN D) 1000 UNITS tablet Take 1,000 Units by mouth daily.     Yes Historical Provider, MD  fish oil-omega-3 fatty acids 1000 MG capsule Take 1,000 mg by mouth daily.     Yes Historical Provider, MD  Glucosamine-Chondroitin (GLUCOSAMINE CHONDR COMPLEX  PO) Take 1 tablet by mouth daily.   Yes Historical Provider, MD  hydrocortisone (ANUSOL-HC) 25 MG suppository Place 25 mg rectally 2 (two) times daily as needed. New med 1 supp pr bid x 5 days,2 refills    Yes [name], MD  lidocaine-prilocaine (EMLA)  cream Apply to Portacath  2 hours prior to access. 10/02/11  Yes Lennis Buzzy Han, MD  LORazepam (ATIVAN) 0.5 MG tablet Take 1 tablet (0.5 mg total) by mouth every 6 (six) hours as needed for anxiety. TAKE 1 TAB UNDER THE TONGUE OR SWALLOW EVERY 6 HOURS AS NEEDED FOR NAUSEA. WILL MAKE DROWSY 02/07/12  Yes Lennis Buzzy Han, MD  Multiple Vitamins-Minerals (MULTIVITAMIN WITH MINERALS) tablet Take 1 tablet by mouth daily. Ca-iron-minerals daily vit for women    Yes Historical Provider, MD  ondansetron (ZOFRAN) 8 MG tablet Take 1-2 tablets (8-16 mg total) by mouth every 12 (twelve) hours as needed for nausea. WILL NOT MAKE YOU DROWSY. 08/25/11  Yes Lennis Buzzy Han, MD  rosuvastatin (CRESTOR) 5 MG tablet Take 5 mg by mouth daily.     Yes Historical Provider, MD  silodosin (RAPAFLO) 4 MG CAPS capsule Take 8 mg by mouth daily with breakfast.   Yes Historical Provider, MD     Allergies:  Allergies  Allergen Reactions  . Voltaren (Diclofenac Sodium)     Swelling/rash    Social History:   reports that she has never smoked. She has never used smokeless tobacco. She reports that she does not drink alcohol or use illicit drugs.  Family History: History reviewed. No pertinent family history.   Physical Exam: Filed Vitals:   02/17/12 0430 02/17/12 0456 02/17/12 0603 02/17/12 0619  BP: 111/55  180/91 147/87  Pulse: 75  90 79  Temp:      TempSrc:      Resp:  16 18 16   SpO2: 97%      Blood pressure 147/87, pulse 79, temperature 98.1 F (36.7 C), temperature source Oral, resp. rate 16, SpO2 97.00%.  GEN:  Pleasant  patient lying in the stretcher in no acute distress; cooperative with exam. PSYCH:  alert and oriented x4; does not appear anxious or depressed; affect is appropriate. HEENT: Mucous membranes pink and anicteric; PERRLA; EOM intact; no cervical lymphadenopathy nor thyromegaly or carotid bruit; no JVD; There were no stridor. Neck is very supple. Breasts:: Not examined CHEST WALL: No  tenderness.  There is a port on her left upper chest. CHEST: Normal respiration, clear to auscultation bilaterally.  HEART: Regular rate and rhythm.  There are no murmur, rub, or gallops.   BACK: No kyphosis or scoliosis; no CVA tenderness ABDOMEN: soft and non-tender; no masses, no organomegaly, normal abdominal bowel sounds; no pannus; no intertriginous candida. There is no rebound and no distention. Rectal Exam: Not done EXTREMITIES: No bone or joint deformity; age-appropriate arthropathy of the hands and knees; no edema; no ulcerations.  There is no calf tenderness. Genitalia: not examined PULSES: 2+ and symmetric SKIN: Normal hydration no rash or ulceration CNS: Cranial nerves 2-12 grossly intact no focal lateralizing neurologic deficit.  Speech is fluent; uvula elevated with phonation, facial symmetry and tongue midline. DTR are normal bilaterally, cerebella exam is intact, barbinski is negative and strengths are equaled bilaterally.  No sensory loss.   Labs on Admission:  Basic Metabolic Panel:  Lab 02/17/12 0960  NA 136  K 3.5  CL 102  CO2 26  GLUCOSE 117*  BUN 11  CREATININE 0.71  CALCIUM  8.5  MG --  PHOS --   Liver Function Tests:  Lab 02/17/12 0210  AST 17  ALT 12  ALKPHOS 58  BILITOT 0.2*  PROT 6.2  ALBUMIN 3.1*   No results found for this basename: LIPASE:5,AMYLASE:5 in the last 168 hours No results found for this basename: AMMONIA:5 in the last 168 hours CBC:  Lab 02/17/12 0210 02/16/12 1218  WBC 4.3 4.9  NEUTROABS -- 3.3  HGB 10.9* 12.0  HCT 32.6* 34.7*  MCV 100.3* 102.6*  PLT 196 200   Cardiac Enzymes: No results found for this basename: CKTOTAL:5,CKMB:5,CKMBINDEX:5,TROPONINI:5 in the last 168 hours  CBG: No results found for this basename: GLUCAP:5 in the last 168 hours   Radiological Exams on Admission: No results found.   Assessment/Plan Present on Admission:  .Endometrial cancer .Vaginal bleeding  PLAN:  Will admit her to [facility] and  send her to [facility] for vaginal packing.  She is quite stable.  Will have heme-onc see her in the am.  I will type and screen and follow her H/H.  I have continued her meds.    Other plans as per orders.  Code Status: FULL Unk Lightning, MD. [facility] Pager [phone] 7pm to 7am.  02/17/2012, 6:39 AM    ADDENDUM:  Dr Rosalia Hammers told me the GYN at [facility] has spoken with her GYN-ONC, and vaginally packed her and discharged her.  He will arrange follow up for her.

## 2012-02-17 NOTE — ED Notes (Signed)
Pt . Unable at this time for lab work.

## 2012-02-17 NOTE — ED Provider Notes (Signed)
History     CSN: [id number]  Arrival date & time 02/16/12  2240   First MD Initiated Contact with Patient 02/17/12 0051      Chief Complaint  Patient presents with  . Vaginal Bleeding    (Consider location/radiation/quality/duration/timing/severity/associated sxs/prior treatment) HPI Comments: 78 y/o female presents to the ED with worsening vaginal bleeding since 9:00 pm tonight. States she has had vaginal bleeding for the past 6 months but never this severe. She has completely filled 6 pads since 9:00 pm. Patient has a history of endometrial cancer which she is receiving chemo once per month. Had total abdominal hysterectomy. Admits to associated lower abdominal cramping which feel like her old menstrual cramps. Has not tried any alleviating factors. Denies lightheadedness, dizziness, chest pain, sob, nausea or vomiting.  Patient is a 78 y.o. female presenting with vaginal bleeding. The history is provided by the patient.  Vaginal Bleeding Associated symptoms include abdominal pain. Pertinent negatives include no chest pain, chills, fever, headaches, nausea, neck pain, vomiting or weakness.    Past Medical History  Diagnosis Date  . Endometrial adenocarcinoma 9/22/211    dx  endometrium bx  . Hypertension   . Hypercholesterolemia   . Chronic kidney disease kidney stones,cystocele  . Cataract forming,no surgery as yet  . Arthritis     osteopenia  . Hx of radiation therapy 06/21/2010-07/18/2010    proximal vagina  . Anxiety   . Vaginal bleeding   . Liver cyst     benign  . History of cervical polypectomy     endocervical polypectomy 5 years ago    Past Surgical History  Procedure Date  . Parathyroid adenoma   . Abdominal hysterectomy total  w/ b/l salpingoopherectomy  . Breast reduction surgery   . Infected skin debridement     debridex3 r thigh,myositis     No family history on file.  History  Substance Use Topics  . Smoking status: Never Smoker   . Smokeless  tobacco: Never Used  . Alcohol Use: Not on file    OB History    Grav Para Term Preterm Abortions TAB SAB Ect Mult Living                  Review of Systems  Constitutional: Negative for fever, chills, appetite change and unexpected weight change.  HENT: Negative for neck pain and neck stiffness.   Respiratory: Negative for shortness of breath.   Cardiovascular: Negative for chest pain.  Gastrointestinal: Positive for abdominal pain. Negative for nausea, vomiting, blood in stool, anal bleeding and rectal pain.  Genitourinary: Positive for vaginal bleeding and pelvic pain. Negative for dysuria and difficulty urinating.  Musculoskeletal: Negative for back pain.  Skin: Negative for color change and pallor.  Neurological: Negative for dizziness, weakness, light-headedness and headaches.  Psychiatric/Behavioral: Negative for confusion.    Allergies  Voltaren  Home Medications   Current Outpatient Rx  Name Route Sig Dispense Refill  . ALPRAZOLAM 0.5 MG PO TABS Oral Take 0.5 mg by mouth Twice daily as needed.    . ASPIRIN 81 MG PO TABS Oral Take 81 mg by mouth daily.      Marland Kitchen BIOTIN PO Oral Take 1 tablet by mouth daily. For hair,skin,nails     . CALCIUM + D PO Oral Take 500 mg by mouth daily.    Marland Kitchen VITAMIN D 1000 UNITS PO TABS Oral Take 1,000 Units by mouth daily.      . OMEGA-3 FATTY ACIDS 1000 MG PO CAPS  Oral Take 1,000 mg by mouth daily.      Marland Kitchen GLUCOSAMINE CHONDR COMPLEX PO Oral Take 1 tablet by mouth daily.    Marland Kitchen HYDROCORTISONE ACETATE 25 MG RE SUPP Rectal Place 25 mg rectally 2 (two) times daily as needed. New med 1 supp pr bid x 5 days,2 refills     . LIDOCAINE-PRILOCAINE 2.5-2.5 % EX CREA  Apply to Portacath  2 hours prior to access. 30 g 1  . LORAZEPAM 0.5 MG PO TABS Oral Take 1 tablet (0.5 mg total) by mouth every 6 (six) hours as needed for anxiety. TAKE 1 TAB UNDER THE TONGUE OR SWALLOW EVERY 6 HOURS AS NEEDED FOR NAUSEA. WILL MAKE DROWSY 30 tablet 0  . MULTI-VITAMIN/MINERALS  PO TABS Oral Take 1 tablet by mouth daily. Ca-iron-minerals daily vit for women     . ONDANSETRON HCL 8 MG PO TABS Oral Take 1-2 tablets (8-16 mg total) by mouth every 12 (twelve) hours as needed for nausea. WILL NOT MAKE YOU DROWSY. 30 tablet 0  . ROSUVASTATIN CALCIUM 5 MG PO TABS Oral Take 5 mg by mouth daily.      Marland Kitchen SILODOSIN 4 MG PO CAPS Oral Take 8 mg by mouth daily with breakfast.      BP 129/73  Pulse 87  Temp 98.1 F (36.7 C) (Oral)  Resp 16  SpO2 97%  Physical Exam  Nursing note and vitals reviewed. Constitutional: She is oriented to person, place, and time. She appears well-developed and well-nourished. No distress.  HENT:  Head: Normocephalic and atraumatic.  Eyes: Conjunctivae normal and EOM are normal.  Neck: Normal range of motion. Neck supple.  Cardiovascular: Normal rate, regular rhythm, normal heart sounds and intact distal pulses.   Pulmonary/Chest: Effort normal and breath sounds normal.  Abdominal: Soft. Normal appearance and bowel sounds are normal. She exhibits no distension and no mass. There is generalized tenderness (most notably suprapubic). There is guarding. There is no rigidity and no rebound.  Genitourinary: Rectum normal. There is tenderness and bleeding (copious thick clots) around the vagina.       Uterus/ovaries/cervix absent  Musculoskeletal: Normal range of motion. She exhibits no edema.  Neurological: She is alert and oriented to person, place, and time.  Skin: Skin is warm and dry.  Psychiatric: She has a normal mood and affect. Her behavior is normal.    ED Course  Procedures (including critical care time)  Labs Reviewed  CBC - Abnormal; Notable for the following:    RBC 3.25 (*)     Hemoglobin 10.9 (*)     HCT 32.6 (*)     MCV 100.3 (*)     All other components within normal limits  COMPREHENSIVE METABOLIC PANEL - Abnormal; Notable for the following:    Glucose, Bld 117 (*)     Albumin 3.1 (*)     Total Bilirubin 0.2 (*)     GFR  calc non Af Amer 79 (*)     All other components within normal limits  PROTIME-INR   No results found.   1. Vaginal bleeding   2. Endometrial cancer       MDM  Spoke with both oncology and gynecology regarding patient. Oncology suggests admitting patient for observation over the weekend due to slight drop in hgb and patient's age. Spoke with Dr. Despina Hidden with gynecology who would like patient admitted at [facility] so the oncologist can see her. States one of his colleagues will come here to evaluate her, but in  the [facility] pack her vagina with Monsel's solution and kerlex. She will be admitted by Dr. Conley Rolls at [facility].      Trevor Mace, PA-C 02/17/12 0415  Trevor Mace, PA-C 02/17/12 0430

## 2012-02-17 NOTE — MAU Note (Signed)
Transferred from [facility] for evaluation of vaginal bleeding/

## 2012-02-17 NOTE — ED Notes (Signed)
IV team notified to access patients porta-cath. 

## 2012-02-17 NOTE — ED Notes (Signed)
Report given to [facility] 

## 2012-02-20 ENCOUNTER — Other Ambulatory Visit: Payer: Self-pay | Admitting: Oncology

## 2012-02-20 NOTE — ED Provider Notes (Signed)
78 y.o. Female with metastatic uterine cancer to vagina s/p hysterectomy presents with vaginal bleeding.  Patient seen and evaluated with ongoing bleeding.  Patient care discussed with oncology by Ms.  Albert.  Patient care also discussed with Drs Conley Rolls and Despina Hidden.  Patient seen and evaluated by me and hemodynamically stable.  Patient transferred to Dr. Despina Hidden for vaginal packing.  Dr. Despina Hidden saw and assessed patient and contacted her gynecological oncologist at [facility].  We had not contacted as patient was not interested in transfer.  Dr. Despina Hidden arranged to continue her packing changes in his office weekly.  He states patient has less than six month life expectancy and did not want to be hospitalized and he discharged her to home from [facility].  I participated and agree with all medical care as provide by Ms. Albert.   Hilario Quarry, MD 02/20/12 1946

## 2012-02-26 ENCOUNTER — Encounter: Payer: Self-pay | Admitting: Oncology

## 2012-02-26 ENCOUNTER — Telehealth: Payer: Self-pay | Admitting: Oncology

## 2012-02-26 ENCOUNTER — Ambulatory Visit (HOSPITAL_BASED_OUTPATIENT_CLINIC_OR_DEPARTMENT_OTHER): Payer: Medicare Other | Admitting: Oncology

## 2012-02-26 ENCOUNTER — Other Ambulatory Visit (HOSPITAL_BASED_OUTPATIENT_CLINIC_OR_DEPARTMENT_OTHER): Payer: Medicare Other

## 2012-02-26 VITALS — BP 135/71 | HR 73 | Temp 97.0°F | Resp 20 | Ht 62.0 in | Wt 160.2 lb

## 2012-02-26 DIAGNOSIS — C7800 Secondary malignant neoplasm of unspecified lung: Secondary | ICD-10-CM

## 2012-02-26 DIAGNOSIS — C541 Malignant neoplasm of endometrium: Secondary | ICD-10-CM

## 2012-02-26 DIAGNOSIS — M810 Age-related osteoporosis without current pathological fracture: Secondary | ICD-10-CM

## 2012-02-26 DIAGNOSIS — C7982 Secondary malignant neoplasm of genital organs: Secondary | ICD-10-CM

## 2012-02-26 DIAGNOSIS — D539 Nutritional anemia, unspecified: Secondary | ICD-10-CM

## 2012-02-26 DIAGNOSIS — C549 Malignant neoplasm of corpus uteri, unspecified: Secondary | ICD-10-CM

## 2012-02-26 LAB — CBC WITH DIFFERENTIAL/PLATELET
Basophils Absolute: 0 10*3/uL (ref 0.0–0.1)
EOS%: 2.5 % (ref 0.0–7.0)
Eosinophils Absolute: 0.1 10*3/uL (ref 0.0–0.5)
HGB: 11.3 g/dL — ABNORMAL LOW (ref 11.6–15.9)
LYMPH%: 15 % (ref 14.0–49.7)
MCH: 32.8 pg (ref 25.1–34.0)
MCV: 100.6 fL (ref 79.5–101.0)
MONO%: 17.9 % — ABNORMAL HIGH (ref 0.0–14.0)
NEUT#: 3.3 10*3/uL (ref 1.5–6.5)
Platelets: 216 10*3/uL (ref 145–400)
RBC: 3.44 10*6/uL — ABNORMAL LOW (ref 3.70–5.45)
RDW: 12.5 % (ref 11.2–14.5)

## 2012-02-26 LAB — COMPREHENSIVE METABOLIC PANEL (CC13)
AST: 14 U/L (ref 5–34)
Alkaline Phosphatase: 64 U/L (ref 40–150)
BUN: 12 mg/dL (ref 7.0–26.0)
Creatinine: 0.8 mg/dL (ref 0.6–1.1)
Glucose: 90 mg/dl (ref 70–99)
Total Bilirubin: 0.25 mg/dL (ref 0.20–1.20)

## 2012-02-26 NOTE — Patient Instructions (Signed)
Stay off ASA

## 2012-02-26 NOTE — Telephone Encounter (Signed)
gv pt appt schedule for November.  °

## 2012-02-26 NOTE — Progress Notes (Signed)
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 She is due cycle 2 doxil on 02-27-12, with plan to add avastin then.  Patient had initially presented with postmenopausal bleeding and was referred to Dr.Elizabeth Skinner. She had stage 1 grade B2 endometrioid adenocarcinoma at surgery 02-18-2010, which was robotic hysterectomy/BSO/pelvic and right periaortic lymphadnectomy with bladder suspension. She had adjuvant brachytherapy by Dr.Kinard 3000cGy in 5 fractions. She did well until she was found to have a metastatic nodule in the anterior vaginal wall in fall 2012, with no evidence of other involvement. She was treated with external beam radiation therapy 02-20-11 thru 03-27-11 then intracavitary brachytherapy x4 thru 05-02-11 by Dr Roselind Messier. She had initial good shrinkage of the tumor nodule after radiation, then in April 2013 was found to have new ulcerations in distal vagina which were biopsy proven to be progressive disease. Repeat CT AP had no evidence of other metastatic disease; she was treated with carboplatin and weekly taxol from 08-29-11 thru 11-14-11, initially with improvement in vaginal bleeding then progression on treatment. She had scans by Dr Clifton James without evidence of distant metastatic disease and was referred to [facility] gyn oncology for anterior exenteration. Repeat CT/PET at [facility] on 12-15-11 reportedly showed a 1 cm FDG avid RUL pulmonary nodule and non-FDG avid 1.4 cm paratracheal and 1.6 cm periaortic nodes. She went to right VATS with upper lobe wedge resection at [facility] by Dr Merrie Roof on 01-03-12. Pathology from [facility] [phone]) had multifocal metastatic  adenocarcinoma consistent with endometrial primary with clear margins and negative level 7 and level 4R lymph nodes.. Dr Clifton James recommended treatment with doxil + avastin if possible; we presently do have doxil despite supply problems and have gotten financial clearance for avastin. Dr Clifton James saw her in follow up on 02-12-12, with essentially stable indurated, necrotic tumor on anterior vaginal wall and no active bleeding or discharge. Patient had abrupt onset of heavy vaginal bleeding on 02-17-12 while she was resting, soaking multiple pads and a towel prior to presenting to ED. She was hemodynamically stable and hemoglobin was down a gram from the day prior, to 10.9. [facility] MD spoke with on call MD for Dr Clifton James, with bleeding having subsided by that point and Monsel's solution used on the vaginal erosive lesion without packing required. She has had no further heavy bleeding, and the slight ongoing vaginal bleeding otherwise seems to have decreased since the first doxil, now using just 2 pads in 24 hrs.  She has been off ASA for past month.  I discussed recent heavy bleeding with Dr Roselind Messier by phone today; she has had maximal RT already.  Patient had further decreased energy after the first doxil, but has felt better overall for past few days. She did not have significant skin reaction after the first treatment, and little nausea. She has had no fever or symptoms of infection. She enjoyed a short trip to the beach after the bleeding episode. She denies chest pain, shortness of breath or cough. She has had no fever. Bowels are moving and she is voiding as usual. She has no LE swelling and no problems with PAC. Remainder of 10 point Review of Systems negative.  Objective:  Vital signs in last  24 hours:  BP 135/71  Pulse 73  Temp 97 F (36.1 C) (Oral)  Resp 20  Ht 5\' 2"  (1.575 m)  Wt 160 lb 3.2 oz (72.666 kg)  BMI 29.30 kg/m2 Weight is up 2 lbs. Easily mobile, respirations not labored,  NAD.   HEENT:PERRLA, sclera clear, anicteric and oropharynx clear, no lesions LymphaticsCervical, supraclavicular, and axillary nodes normal. Resp: clear to auscultation bilaterally and normal percussion bilaterally Incisions from VATS right chest not remarkable, not tender. Cardio: regular rate and rhythm GI: soft, nontender, some bowel sounds, no appreciable HSM. Extremities: extremities normal, atraumatic, no cyanosis or edema Digital vaginal exam done, with tumor in lower vagina, minimal pinkish discharge on glove and no frank bleeding, no fistula apparent, not tender. Neuro:nonfocal Skin without erythema, rash, desquamation Portacath-without erythema  Lab Results:  Results for orders placed in visit on 02/26/12  CBC WITH DIFFERENTIAL      Component Value Range   WBC 5.1  3.9 - 10.3 10e3/uL   NEUT# 3.3  1.5 - 6.5 10e3/uL   HGB 11.3 (*) 11.6 - 15.9 g/dL   HCT 91.4 (*) 78.2 - 95.6 %   Platelets 216  145 - 400 10e3/uL   MCV 100.6  79.5 - 101.0 fL   MCH 32.8  25.1 - 34.0 pg   MCHC 32.7  31.5 - 36.0 g/dL   RBC 2.13 (*) 0.86 - 5.78 10e6/uL   RDW 12.5  11.2 - 14.5 %   lymph# 0.8 (*) 0.9 - 3.3 10e3/uL   MONO# 0.9  0.1 - 0.9 10e3/uL   Eosinophils Absolute 0.1  0.0 - 0.5 10e3/uL   Basophils Absolute 0.0  0.0 - 0.1 10e3/uL   NEUT% 64.4  38.4 - 76.8 %   LYMPH% 15.0  14.0 - 49.7 %   MONO% 17.9 (*) 0.0 - 14.0 %   EOS% 2.5  0.0 - 7.0 %   BASO% 0.2  0.0 - 2.0 %  COMPREHENSIVE METABOLIC PANEL (CC13)      Component Value Range   Sodium 138  136 - 145 mEq/L   Potassium 4.0  3.5 - 5.1 mEq/L   Chloride 105  98 - 107 mEq/L   CO2 27  22 - 29 mEq/L   Glucose 90  70 - 99 mg/dl   BUN 46.9  7.0 - 62.9 mg/dL   Creatinine 0.8  0.6 - 1.1 mg/dL   Total Bilirubin 5.28  0.20 - 1.20 mg/dL   Alkaline Phosphatase 64  40 - 150 U/L   AST 14  5 - 34 U/L   ALT 12  0 - 55 U/L   Total Protein 6.6  6.4 - 8.3 g/dL   Albumin 3.5  3.5 - 5.0 g/dL   Calcium 9.5  8.4 - 41.3 mg/dL  UA PROTEIN, DIPSTICK -  [facility]      Component Value Range   Protein, ur Negative  Negative- <30 mg/dL     Studies/Results: Last imaging of chest was at [facility] Sept 2013 and unremarkable CXR at [facility] 01-16-12.  Medications: I have reviewed the patient's current medications. As above, she is not on ASA now.  We have discussed addition of Avastin to cycle 2 doxil tomorrow. She understands that this could cause more bleeding, or alternatively might give better control of the tumor and improve bleeding. She prefers to try avastin even with possible increased bleeding or other adverse effects. If tolerated, we can use the avastin ~ every 2 weeks with the doxil still every 4 weeks.  I will see her back shortly prior to second avastin, or sooner if needed. She is to see Dr Clifton James next on Dec 5, which will be prior to cycle 3 doxil.  Assessment/Plan: 1. Metastatic endometrial carcinoma to vagina and lung: post resection of single pulmonary nodule at [facility] and now planning addition of avastin to doxil.  2.Self-limited heavy vaginal bleeding 02-17-12. Hemoglobin is improved today 3.PAC in 4. Hx HTN, previously on lisinopril 5.Hx osteoporosis, strep fasciitis, nephrolithiasis 6.flu vaccine done.  Patient was in agreement with plan above Torry Adamczak P, MD   02/26/2012, 3:32 PM

## 2012-02-27 ENCOUNTER — Ambulatory Visit (HOSPITAL_BASED_OUTPATIENT_CLINIC_OR_DEPARTMENT_OTHER): Payer: Medicare Other

## 2012-02-27 ENCOUNTER — Ambulatory Visit: Payer: Medicare Other | Admitting: Nutrition

## 2012-02-27 VITALS — BP 116/67 | HR 63 | Temp 97.5°F

## 2012-02-27 DIAGNOSIS — Z5112 Encounter for antineoplastic immunotherapy: Secondary | ICD-10-CM

## 2012-02-27 DIAGNOSIS — C541 Malignant neoplasm of endometrium: Secondary | ICD-10-CM

## 2012-02-27 DIAGNOSIS — C7982 Secondary malignant neoplasm of genital organs: Secondary | ICD-10-CM

## 2012-02-27 DIAGNOSIS — C549 Malignant neoplasm of corpus uteri, unspecified: Secondary | ICD-10-CM

## 2012-02-27 DIAGNOSIS — Z5111 Encounter for antineoplastic chemotherapy: Secondary | ICD-10-CM

## 2012-02-27 MED ORDER — ONDANSETRON 8 MG/50ML IVPB (CHCC)
8.0000 mg | Freq: Once | INTRAVENOUS | Status: AC
  Administered 2012-02-27: 8 mg via INTRAVENOUS

## 2012-02-27 MED ORDER — SODIUM CHLORIDE 0.9 % IV SOLN
Freq: Once | INTRAVENOUS | Status: AC
  Administered 2012-02-27: 09:00:00 via INTRAVENOUS

## 2012-02-27 MED ORDER — SODIUM CHLORIDE 0.9 % IV SOLN
10.0000 mg/kg | Freq: Once | INTRAVENOUS | Status: AC
  Administered 2012-02-27: 725 mg via INTRAVENOUS
  Filled 2012-02-27: qty 29

## 2012-02-27 MED ORDER — HEPARIN SOD (PORK) LOCK FLUSH 100 UNIT/ML IV SOLN
500.0000 [IU] | Freq: Once | INTRAVENOUS | Status: AC | PRN
  Administered 2012-02-27: 500 [IU]
  Filled 2012-02-27: qty 5

## 2012-02-27 MED ORDER — SODIUM CHLORIDE 0.9 % IJ SOLN
10.0000 mL | INTRAMUSCULAR | Status: DC | PRN
  Administered 2012-02-27: 10 mL
  Filled 2012-02-27: qty 10

## 2012-02-27 MED ORDER — DEXAMETHASONE SODIUM PHOSPHATE 10 MG/ML IJ SOLN
10.0000 mg | Freq: Once | INTRAMUSCULAR | Status: AC
  Administered 2012-02-27: 10 mg via INTRAVENOUS

## 2012-02-27 MED ORDER — DOXORUBICIN HCL LIPOSOMAL CHEMO INJECTION 2 MG/ML
40.0000 mg/m2 | Freq: Once | INTRAVENOUS | Status: AC
  Administered 2012-02-27: 72 mg via INTRAVENOUS
  Filled 2012-02-27: qty 36

## 2012-02-27 NOTE — Patient Instructions (Signed)
[facility] Discharge Instructions for Patients Receiving Chemotherapy  Today you received the following chemotherapy agents: Avastin, Doxil  To help prevent nausea and vomiting after your treatment, we encourage you to take your nausea medication as directed by your MD. If you develop nausea and vomiting that is not controlled by your nausea medication, call the clinic. If it is after clinic hours your family physician or the after hours number for the clinic or go to the Emergency Department.   BELOW ARE SYMPTOMS THAT SHOULD BE REPORTED IMMEDIATELY:  *FEVER GREATER THAN 100.5 F  *CHILLS WITH OR WITHOUT FEVER  NAUSEA AND VOMITING THAT IS NOT CONTROLLED WITH YOUR NAUSEA MEDICATION  *UNUSUAL SHORTNESS OF BREATH  *UNUSUAL BRUISING OR BLEEDING  TENDERNESS IN MOUTH AND THROAT WITH OR WITHOUT PRESENCE OF ULCERS  *URINARY PROBLEMS  *BOWEL PROBLEMS  UNUSUAL RASH Items with * indicate a potential emergency and should be followed up as soon as possible.  One of the nurses will contact you 24 hours after your treatment. Please let the nurse know about any problems that you may have experienced. Feel free to call the clinic you have any questions or concerns. The clinic phone number is [phone].

## 2012-02-27 NOTE — Progress Notes (Signed)
I spoke with patient in the chemotherapy area. Her weight has been stable within 5 pounds up or down since June of this year. Weight was documented as 160.2 pounds November 11. Patient reports nausea continues but is controlled with medication. She would like assistance with recipes for various smoothies. She also would like tips on ways to increase water consumption.  Nutrition diagnosis: Unintended weight loss continues.  Intervention: Educated patient on ways to incorporate fruits and vegetables and protein into smoothies. I provided recipes for her to take with her. I've encouraged her to add fruits or true lemon to water for increased tolerance. I provided her with samples and recipes. I've answered additional questions.  Monitoring, evaluation, goals: The patient continues to tolerate a healthy plant-based diet and has maintained weight within 5 pounds over the last 5 months.  Next visit: I do not have a followup scheduled. Patient has my contact information and agrees to contact me for questions or concerns.

## 2012-02-27 NOTE — Progress Notes (Signed)
Post avastin BP 

## 2012-02-28 ENCOUNTER — Telehealth: Payer: Self-pay | Admitting: *Deleted

## 2012-02-28 NOTE — Telephone Encounter (Signed)
[name] is doing well today.  Says the third day posy treatment is the day she feels worse.  Is eating and drinking fluids eating soup but knows she needs to increase to the 64 oz recommendation.  Denies questions.  Asked that she call if needed.

## 2012-02-28 NOTE — Telephone Encounter (Signed)
Message copied by Augusto Garbe on Wed Feb 28, 2012 11:13 AM ------      Message from: Faith Rogue F      Created: Tue Feb 27, 2012 10:31 AM      Regarding: chemo f/u call       Avastin (new drug in addition to her Doxil)            Dr. Darrold Span            Home number is ok

## 2012-03-10 ENCOUNTER — Other Ambulatory Visit: Payer: Self-pay | Admitting: Oncology

## 2012-03-11 ENCOUNTER — Other Ambulatory Visit (HOSPITAL_BASED_OUTPATIENT_CLINIC_OR_DEPARTMENT_OTHER): Payer: Medicare Other | Admitting: Lab

## 2012-03-11 ENCOUNTER — Telehealth: Payer: Self-pay | Admitting: *Deleted

## 2012-03-11 ENCOUNTER — Ambulatory Visit (HOSPITAL_BASED_OUTPATIENT_CLINIC_OR_DEPARTMENT_OTHER): Payer: Medicare Other | Admitting: Oncology

## 2012-03-11 ENCOUNTER — Encounter: Payer: Self-pay | Admitting: Oncology

## 2012-03-11 VITALS — BP 157/79 | HR 66 | Temp 97.0°F | Resp 18 | Ht 62.0 in | Wt 157.4 lb

## 2012-03-11 DIAGNOSIS — C7800 Secondary malignant neoplasm of unspecified lung: Secondary | ICD-10-CM

## 2012-03-11 DIAGNOSIS — C541 Malignant neoplasm of endometrium: Secondary | ICD-10-CM

## 2012-03-11 DIAGNOSIS — Z5111 Encounter for antineoplastic chemotherapy: Secondary | ICD-10-CM

## 2012-03-11 DIAGNOSIS — C549 Malignant neoplasm of corpus uteri, unspecified: Secondary | ICD-10-CM

## 2012-03-11 DIAGNOSIS — C7982 Secondary malignant neoplasm of genital organs: Secondary | ICD-10-CM

## 2012-03-11 LAB — CBC WITH DIFFERENTIAL/PLATELET
Basophils Absolute: 0 10*3/uL (ref 0.0–0.1)
Eosinophils Absolute: 0.1 10*3/uL (ref 0.0–0.5)
HCT: 34.3 % — ABNORMAL LOW (ref 34.8–46.6)
HGB: 11.5 g/dL — ABNORMAL LOW (ref 11.6–15.9)
LYMPH%: 15.1 % (ref 14.0–49.7)
MCV: 100.9 fL (ref 79.5–101.0)
MONO#: 0.4 10*3/uL (ref 0.1–0.9)
NEUT#: 3.2 10*3/uL (ref 1.5–6.5)
NEUT%: 72.3 % (ref 38.4–76.8)
Platelets: 230 10*3/uL (ref 145–400)
RBC: 3.4 10*6/uL — ABNORMAL LOW (ref 3.70–5.45)
WBC: 4.4 10*3/uL (ref 3.9–10.3)

## 2012-03-11 LAB — UA PROTEIN, DIPSTICK - CHCC: Protein, ur: NEGATIVE mg/dL

## 2012-03-11 NOTE — Progress Notes (Signed)
OFFICE PROGRESS NOTE   03/11/2012   Physicians:J.Osborne, E.Skinner, J.Kinard, K.Supple, M.Johnson, P.Gehrig, Merrie Roof [facility] thoracic surgery), S.MacDiarmid   INTERVAL HISTORY:  Patient is seen, alone for visit, in continuing attention to her metastatic endometrial carcinoma involving vagina and lung, having had second doxil with first avastin on 02-27-12. Vaginal bleeding has significantly improved in past week.  Patient presented with postmenopausal bleeding and was referred to Dr.Elizabeth Skinner. She had stage 1 grade B2 endometrioid adenocarcinoma at surgery 02-18-2010, which was robotic hysterectomy/BSO/pelvic and right periaortic lymphadnectomy with bladder suspension. She had adjuvant brachytherapy by Dr.Kinard 3000cGy in 5 fractions. She did well until she was found to have a metastatic nodule in the anterior vaginal wall in fall 2012, with no evidence of other involvement. She was treated with external beam radiation therapy 02-20-11 thru 03-27-11 then intracavitary brachytherapy x4 thru 05-02-11 by Dr Roselind Messier. She had initial good shrinkage of the tumor nodule after radiation, then in April 2013 was found to have new ulcerations in distal vagina which were biopsy proven to be progressive disease. Repeat CT AP had no evidence of other metastatic disease; she was treated with carboplatin and weekly taxol from 08-29-11 thru 11-14-11, initially with improvement in vaginal bleeding then progression on treatment. She had scans by Dr Clifton James without evidence of distant metastatic disease and was referred to [facility] gyn oncology for anterior exenteration. Repeat CT/PET at [facility] on 12-15-11 reportedly showed a 1 cm FDG avid RUL pulmonary nodule and non-FDG avid 1.4 cm paratracheal and 1.6 cm periaortic nodes. She went to right VATS with upper lobe wedge resection at [facility] by Dr Merrie Roof on 01-03-12. Pathology from [facility] [phone]) had multifocal metastatic adenocarcinoma consistent with endometrial primary with  clear margins and negative level 7 and level 4R lymph nodes.. Dr Clifton James recommended treatment with doxil + avastin if possible; we presently do have doxil despite supply problems and have gotten financial clearance for avastin. Dr Clifton James saw her in follow up on 02-12-12, with essentially stable indurated, necrotic tumor on anterior vaginal wall and no active bleeding or discharge. Patient had abrupt onset of heavy vaginal bleeding on 02-17-12 while she was resting, soaking multiple pads and a towel prior to presenting to ED. She was hemodynamically stable and hemoglobin was down a gram from the day prior, to 10.9. [facility] MD spoke with on call MD for Dr Clifton James, with bleeding having subsided by that point and Monsel's solution used on the vaginal erosive lesion without packing required. She had first doxil on 01-31-12, then doxil + avastin on 02-27-12. We will try the avastin every 14 days with doxil every 28 days. She is to see Dr Clifton James again in early Dec.  Patient felt generally badly during the week after doxil, tho she did not have any skin reaction. She had little appetite then, tho she did eat and drink fluids; she had pelvic pressure and some dysuria, which has now entirely resolved. She had one slight nosebleed also last week, but improvement in the vaginal bleeding for past week as above. We have discussed using saline nose spray several times daily while on avastin, and having afrin nose spray available which she should use if significant epistaxis. She had a (large) tick on upper left arm 03-08-12, head did not come out when body removed, has used neosporin there. She denies shortness of breath, cough, any pain. She is feeling well overall today. Remainder of 10 point Review of Systems negative.  Objective:  Vital signs in last  24 hours:  BP 157/79  Pulse 66  Temp 97 F (36.1 C) (Oral)  Resp 18  Ht 5\' 2"  (1.575 m)  Wt 157 lb 6.4 oz (71.396 kg)  BMI 28.79 kg/m2 Easily  ambulatory, looks comfortable, respirations not labored RA.   HEENT:PERRLA, sclera clear, anicteric, oropharynx clear, no lesions and neck supple with midline trachea. No epistaxis now. LymphaticsCervical, supraclavicular, and axillary nodes normal. No inguinal adenopathy Resp: clear to auscultation bilaterally and normal percussion bilaterally Cardio: regular rate and rhythm GI: soft, non-tender; bowel sounds normal; no masses,  no organomegaly Not tender in epigastrium Extremities: extremities normal, atraumatic, no cyanosis or edema. Area of the tick bite left upper inner arm has 0.5 cm slight erythema, no heat or swelling, not target lesion.  Neuro:no sensory deficits noted Skin without erythema or desquamation and not extremely dry. Palms not remarkable. Portacath-without erythema or tenderness  Lab Results:  Results for orders placed in visit on 03/11/12  CBC WITH DIFFERENTIAL      Component Value Range   WBC 4.4  3.9 - 10.3 10e3/uL   NEUT# 3.2  1.5 - 6.5 10e3/uL   HGB 11.5 (*) 11.6 - 15.9 g/dL   HCT 56.2 (*) 13.0 - 86.5 %   Platelets 230  145 - 400 10e3/uL   MCV 100.9  79.5 - 101.0 fL   MCH 33.9  25.1 - 34.0 pg   MCHC 33.6  31.5 - 36.0 g/dL   RBC 7.84 (*) 6.96 - 2.95 10e6/uL   RDW 13.1  11.2 - 14.5 %   lymph# 0.7 (*) 0.9 - 3.3 10e3/uL   MONO# 0.4  0.1 - 0.9 10e3/uL   Eosinophils Absolute 0.1  0.0 - 0.5 10e3/uL   Basophils Absolute 0.0  0.0 - 0.1 10e3/uL   NEUT% 72.3  38.4 - 76.8 %   LYMPH% 15.1  14.0 - 49.7 %   MONO% 8.8  0.0 - 14.0 %   EOS% 3.3  0.0 - 7.0 %   BASO% 0.5  0.0 - 2.0 %  UA PROTEIN, DIPSTICK - [facility]      Component Value Range   Protein, ur Negative  Negative- <30 mg/dL     Studies/Results:  No results found.  Medications: I have reviewed the patient's current medications. I have recommended that she use saline nose spray several times daily and that she keep Afrin on hand to use if significant epistaxis, and instructed her to call if so. She will use  neosporin to the area of the tick bite, but with large size of tick, I do not think she needs lyme prophylaxis with antibiotics now. She has had flu vaccine  Patient is also encouraged by decreased vaginal bleeding and is in agreement with continuing doxil with avastin. She will have doxil with avastin 03-26-12 and I will see her a few days prior to avastin on 04-09-12. She has follow up visit scheduled with Dr Clifton James in [place] office.   Assessment/Plan: 1.Metastatic endometrial carcinoma to vagina and lung, post resection of single pulmonary nodule and now on doxil and avastin (trying doxil q 4 weeks and avastin q 2 weeks) 2.PAC in 3.hx HTN, previously on lisinopril 4.hx osteoporosis, strep fasciitis, nephrolithiasis 5.anemia improving 6.tick bite as above      LIVESAY,LENNIS P, MD   03/11/2012, 10:48 AM

## 2012-03-11 NOTE — Telephone Encounter (Signed)
doxil avastin Dec 10 cbc/urine protein/ cmet from PAC in infusion////avastin 12-24 UA protein

## 2012-03-11 NOTE — Patient Instructions (Signed)
Get saline nose spray (over the counter at pharmacy, usually with the children's cold and allergy meds, or ask pharmacist) to use several times every day. This will keep nose from drying out and lessen nose bleeds from avastin.   Get Afrin nose spray. If your nose bleeds significantly, spray each side with Afrin to constrict the blood vessels and call if so.  Call if bladder pressure or symptoms that might be urinary tract infection.  Call primary MD or oncology office if more redness around the tick bite.

## 2012-03-12 ENCOUNTER — Telehealth: Payer: Self-pay | Admitting: *Deleted

## 2012-03-12 ENCOUNTER — Ambulatory Visit (HOSPITAL_BASED_OUTPATIENT_CLINIC_OR_DEPARTMENT_OTHER): Payer: Medicare Other

## 2012-03-12 VITALS — BP 118/72 | HR 68 | Temp 97.5°F

## 2012-03-12 DIAGNOSIS — C541 Malignant neoplasm of endometrium: Secondary | ICD-10-CM

## 2012-03-12 DIAGNOSIS — C549 Malignant neoplasm of corpus uteri, unspecified: Secondary | ICD-10-CM

## 2012-03-12 DIAGNOSIS — C7800 Secondary malignant neoplasm of unspecified lung: Secondary | ICD-10-CM

## 2012-03-12 DIAGNOSIS — Z5112 Encounter for antineoplastic immunotherapy: Secondary | ICD-10-CM

## 2012-03-12 DIAGNOSIS — C7982 Secondary malignant neoplasm of genital organs: Secondary | ICD-10-CM

## 2012-03-12 MED ORDER — SODIUM CHLORIDE 0.9 % IJ SOLN
10.0000 mL | INTRAMUSCULAR | Status: DC | PRN
  Administered 2012-03-12: 10 mL
  Filled 2012-03-12: qty 10

## 2012-03-12 MED ORDER — SODIUM CHLORIDE 0.9 % IV SOLN
10.0000 mg/kg | Freq: Once | INTRAVENOUS | Status: AC
  Administered 2012-03-12: 725 mg via INTRAVENOUS
  Filled 2012-03-12: qty 29

## 2012-03-12 MED ORDER — SODIUM CHLORIDE 0.9 % IV SOLN
Freq: Once | INTRAVENOUS | Status: AC
  Administered 2012-03-12: 08:00:00 via INTRAVENOUS

## 2012-03-12 MED ORDER — HEPARIN SOD (PORK) LOCK FLUSH 100 UNIT/ML IV SOLN
500.0000 [IU] | Freq: Once | INTRAVENOUS | Status: AC | PRN
  Administered 2012-03-12: 500 [IU]
  Filled 2012-03-12: qty 5

## 2012-03-12 NOTE — Patient Instructions (Addendum)
[facility] Discharge Instructions for Patients Receiving Chemotherapy  Today you received the following chemotherapy agents avastin  For  nausea and vomiting after your treatment, we encourage you to take your nausea medication if needed.   If you develop nausea and vomiting that is not controlled by your nausea medication, call the clinic. If it is after clinic hours your family physician or the after hours number for the clinic or go to the Emergency Department.   BELOW ARE SYMPTOMS THAT SHOULD BE REPORTED IMMEDIATELY:  *FEVER GREATER THAN 100.5 F  *CHILLS WITH OR WITHOUT FEVER  NAUSEA AND VOMITING THAT IS NOT CONTROLLED WITH YOUR NAUSEA MEDICATION  *UNUSUAL SHORTNESS OF BREATH  *UNUSUAL BRUISING OR BLEEDING  TENDERNESS IN MOUTH AND THROAT WITH OR WITHOUT PRESENCE OF ULCERS  *URINARY PROBLEMS  *BOWEL PROBLEMS  UNUSUAL RASH Items with * indicate a potential emergency and should be followed up as soon as possible.  . Feel free to call the clinic you have any questions or concerns. The clinic phone number is [phone].   I have been informed and understand all the instructions given to me. I know to contact the clinic, my physician, or go to the Emergency Department if any problems should occur. I do not have any questions at this time, but understand that I may call the clinic during office hours   should I have any questions or need assistance in obtaining follow up care.    __________________________________________  _____________  __________ Signature of Patient or Authorized Representative            Date                   Time    __________________________________________ Nurse's Signature

## 2012-03-12 NOTE — Telephone Encounter (Signed)
Per chemo RN and POF I have scheduled appts.  JMW  

## 2012-03-26 ENCOUNTER — Telehealth: Payer: Self-pay | Admitting: *Deleted

## 2012-03-26 ENCOUNTER — Other Ambulatory Visit (HOSPITAL_BASED_OUTPATIENT_CLINIC_OR_DEPARTMENT_OTHER): Payer: Medicare Other | Admitting: Lab

## 2012-03-26 ENCOUNTER — Ambulatory Visit (HOSPITAL_BASED_OUTPATIENT_CLINIC_OR_DEPARTMENT_OTHER): Payer: Medicare Other

## 2012-03-26 VITALS — BP 155/82 | HR 67 | Temp 97.6°F | Resp 18

## 2012-03-26 DIAGNOSIS — C541 Malignant neoplasm of endometrium: Secondary | ICD-10-CM

## 2012-03-26 DIAGNOSIS — C549 Malignant neoplasm of corpus uteri, unspecified: Secondary | ICD-10-CM

## 2012-03-26 DIAGNOSIS — Z5112 Encounter for antineoplastic immunotherapy: Secondary | ICD-10-CM

## 2012-03-26 DIAGNOSIS — Z5111 Encounter for antineoplastic chemotherapy: Secondary | ICD-10-CM

## 2012-03-26 LAB — CBC WITH DIFFERENTIAL/PLATELET
Basophils Absolute: 0 10*3/uL (ref 0.0–0.1)
EOS%: 4.6 % (ref 0.0–7.0)
HCT: 36.3 % (ref 34.8–46.6)
HGB: 12.1 g/dL (ref 11.6–15.9)
LYMPH%: 20.1 % (ref 14.0–49.7)
MCH: 32.5 pg (ref 25.1–34.0)
NEUT%: 52 % (ref 38.4–76.8)
Platelets: 198 10*3/uL (ref 145–400)
lymph#: 0.7 10*3/uL — ABNORMAL LOW (ref 0.9–3.3)

## 2012-03-26 LAB — COMPREHENSIVE METABOLIC PANEL (CC13)
ALT: 15 U/L (ref 0–55)
AST: 19 U/L (ref 5–34)
Albumin: 3.4 g/dL — ABNORMAL LOW (ref 3.5–5.0)
Alkaline Phosphatase: 67 U/L (ref 40–150)
BUN: 13 mg/dL (ref 7.0–26.0)
Calcium: 9.2 mg/dL (ref 8.4–10.4)
Chloride: 106 mEq/L (ref 98–107)
Potassium: 4.2 mEq/L (ref 3.5–5.1)

## 2012-03-26 MED ORDER — SODIUM CHLORIDE 0.9 % IV SOLN
Freq: Once | INTRAVENOUS | Status: AC
  Administered 2012-03-26: 09:00:00 via INTRAVENOUS

## 2012-03-26 MED ORDER — SODIUM CHLORIDE 0.9 % IJ SOLN
10.0000 mL | INTRAMUSCULAR | Status: DC | PRN
  Administered 2012-03-26: 10 mL
  Filled 2012-03-26: qty 10

## 2012-03-26 MED ORDER — SODIUM CHLORIDE 0.9 % IV SOLN
10.0000 mg/kg | Freq: Once | INTRAVENOUS | Status: AC
  Administered 2012-03-26: 725 mg via INTRAVENOUS
  Filled 2012-03-26: qty 29

## 2012-03-26 MED ORDER — HEPARIN SOD (PORK) LOCK FLUSH 100 UNIT/ML IV SOLN
500.0000 [IU] | Freq: Once | INTRAVENOUS | Status: AC | PRN
  Administered 2012-03-26: 500 [IU]
  Filled 2012-03-26: qty 5

## 2012-03-26 MED ORDER — DOXORUBICIN HCL LIPOSOMAL CHEMO INJECTION 2 MG/ML
40.0000 mg/m2 | Freq: Once | INTRAVENOUS | Status: AC
  Administered 2012-03-26: 72 mg via INTRAVENOUS
  Filled 2012-03-26: qty 36

## 2012-03-26 MED ORDER — ONDANSETRON 8 MG/50ML IVPB (CHCC)
8.0000 mg | Freq: Once | INTRAVENOUS | Status: AC
  Administered 2012-03-26: 8 mg via INTRAVENOUS

## 2012-03-26 MED ORDER — DEXAMETHASONE SODIUM PHOSPHATE 10 MG/ML IJ SOLN
10.0000 mg | Freq: Once | INTRAMUSCULAR | Status: AC
  Administered 2012-03-26: 10 mg via INTRAVENOUS

## 2012-03-26 NOTE — Telephone Encounter (Signed)
Per staff phone call form sccheduler I have rescheduled hr appts for 12/24 to earlier in the morning. I have called and gave the appts to her husband.   JMW

## 2012-03-26 NOTE — Patient Instructions (Addendum)
 Cancer Center Discharge Instructions for Patients Receiving Chemotherapy  Today you received the following chemotherapy agents doxil/avastin  To help prevent nausea and vomiting after your treatment, we encourage you to take your nausea medication  and take it as often as prescribed   If you develop nausea and vomiting that is not controlled by your nausea medication, call the clinic. If it is after clinic hours your family physician or the after hours number for the clinic or go to the Emergency Department.   BELOW ARE SYMPTOMS THAT SHOULD BE REPORTED IMMEDIATELY:  *FEVER GREATER THAN 100.5 F  *CHILLS WITH OR WITHOUT FEVER  NAUSEA AND VOMITING THAT IS NOT CONTROLLED WITH YOUR NAUSEA MEDICATION  *UNUSUAL SHORTNESS OF BREATH  *UNUSUAL BRUISING OR BLEEDING  TENDERNESS IN MOUTH AND THROAT WITH OR WITHOUT PRESENCE OF ULCERS  *URINARY PROBLEMS  *BOWEL PROBLEMS  UNUSUAL RASH Items with * indicate a potential emergency and should be followed up as soon as possible.  One of the nurses will contact you 24 hours after your treatment. Please let the nurse know about any problems that you may have experienced. Feel free to call the clinic you have any questions or concerns. The clinic phone number is [phone].   I have been informed and understand all the instructions given to me. I know to contact the clinic, my physician, or go to the Emergency Department if any problems should occur. I do not have any questions at this time, but understand that I may call the clinic during office hours   should I have any questions or need assistance in obtaining follow up care.    __________________________________________  _____________  __________ Signature of Patient or Authorized Representative            Date                   Time    __________________________________________ Nurse's Signature

## 2012-03-27 ENCOUNTER — Other Ambulatory Visit: Payer: Self-pay | Admitting: Certified Registered Nurse Anesthetist

## 2012-04-03 ENCOUNTER — Ambulatory Visit (HOSPITAL_BASED_OUTPATIENT_CLINIC_OR_DEPARTMENT_OTHER): Payer: Medicare Other | Admitting: Lab

## 2012-04-03 ENCOUNTER — Telehealth: Payer: Self-pay | Admitting: *Deleted

## 2012-04-03 ENCOUNTER — Other Ambulatory Visit (HOSPITAL_BASED_OUTPATIENT_CLINIC_OR_DEPARTMENT_OTHER): Payer: Medicare Other | Admitting: Lab

## 2012-04-03 ENCOUNTER — Encounter: Payer: Self-pay | Admitting: Oncology

## 2012-04-03 ENCOUNTER — Ambulatory Visit (HOSPITAL_BASED_OUTPATIENT_CLINIC_OR_DEPARTMENT_OTHER): Payer: Medicare Other | Admitting: Oncology

## 2012-04-03 ENCOUNTER — Telehealth: Payer: Self-pay | Admitting: Oncology

## 2012-04-03 VITALS — BP 146/78 | HR 73 | Temp 96.7°F | Resp 18 | Ht 62.0 in | Wt 158.2 lb

## 2012-04-03 DIAGNOSIS — N390 Urinary tract infection, site not specified: Secondary | ICD-10-CM

## 2012-04-03 DIAGNOSIS — C7800 Secondary malignant neoplasm of unspecified lung: Secondary | ICD-10-CM

## 2012-04-03 DIAGNOSIS — C541 Malignant neoplasm of endometrium: Secondary | ICD-10-CM

## 2012-04-03 DIAGNOSIS — C549 Malignant neoplasm of corpus uteri, unspecified: Secondary | ICD-10-CM

## 2012-04-03 DIAGNOSIS — R32 Unspecified urinary incontinence: Secondary | ICD-10-CM

## 2012-04-03 DIAGNOSIS — C7982 Secondary malignant neoplasm of genital organs: Secondary | ICD-10-CM

## 2012-04-03 LAB — CBC WITH DIFFERENTIAL/PLATELET
Basophils Absolute: 0 10*3/uL (ref 0.0–0.1)
Eosinophils Absolute: 0.2 10*3/uL (ref 0.0–0.5)
HGB: 12.8 g/dL (ref 11.6–15.9)
LYMPH%: 18.4 % (ref 14.0–49.7)
MCV: 96.7 fL (ref 79.5–101.0)
MONO%: 17.3 % — ABNORMAL HIGH (ref 0.0–14.0)
NEUT#: 2.8 10*3/uL (ref 1.5–6.5)
Platelets: 170 10*3/uL (ref 145–400)
RBC: 3.95 10*6/uL (ref 3.70–5.45)

## 2012-04-03 LAB — URINALYSIS, MICROSCOPIC - CHCC
Glucose: NEGATIVE g/dL
Ketones: NEGATIVE mg/dL
Nitrite: NEGATIVE
Protein: NEGATIVE mg/dL

## 2012-04-03 NOTE — Telephone Encounter (Signed)
gv and printed pt appt schedule for Dec and Jan...the patient aware to go back to the lab...emailed michelle to add chemo...the patient aware

## 2012-04-03 NOTE — Telephone Encounter (Signed)
Per staff message and POF I have scheduled appts.  JMW  

## 2012-04-03 NOTE — Patient Instructions (Signed)
Try voiding on regular schedule during day, at least every 2 hours  We will let you know about the urine culture sent today  Call if significant nosebleeds happen regularly

## 2012-04-03 NOTE — Progress Notes (Signed)
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 She had third doxil on 03-26-12 and has had avastin 11-12, 11-26 and 03-26-12. Vaginal bleeding has improved, only minimal, intermittent spotting now. She has had some epistaxis, mostly minimal tho 2 episodes that were heavier and lasted ~ 30 min; she is not aware of other bleeding.   Patient presented with postmenopausal bleeding and was referred to Dr.Elizabeth Skinner. She had stage 1 grade B2 endometrioid adenocarcinoma at surgery 02-18-2010, which was robotic hysterectomy/BSO/pelvic and right periaortic lymphadnectomy with bladder suspension. She had adjuvant brachytherapy by Dr.Kinard 3000cGy in 5 fractions. She did well until she was found to have a metastatic nodule in the anterior vaginal wall in fall 2012, with no evidence of other involvement. She was treated with external beam radiation therapy 02-20-11 thru 03-27-11 then intracavitary brachytherapy x4 thru 05-02-11 by Dr Roselind Messier. She had initial good shrinkage of the tumor nodule after radiation, then in April 2013 was found to have new ulcerations in distal vagina which were biopsy proven to be progressive disease. Repeat CT AP had no evidence of other metastatic disease; she was treated with carboplatin and weekly taxol from 08-29-11 thru 11-14-11, initially with improvement in vaginal bleeding then progression on treatment. She had scans by Dr Clifton James without evidence of distant metastatic disease and was referred to [facility] gyn oncology for anterior exenteration. Repeat CT/PET at [facility] on 12-15-11 reportedly showed a 1 cm FDG avid RUL pulmonary nodule and non-FDG avid 1.4 cm paratracheal and 1.6 cm  periaortic nodes. She went to right VATS with upper lobe wedge resection at [facility] by Dr Merrie Roof on 01-03-12. Pathology from [facility] [phone]) had multifocal metastatic adenocarcinoma consistent with endometrial primary with clear margins and negative level 7 and level 4R lymph nodes.. Dr Clifton James recommended treatment with doxil + avastin if possible; we presently do have doxil despite supply problems and have gotten financial clearance for avastin. Dr Clifton James saw her in follow up on 02-12-12, with essentially stable indurated, necrotic tumor on anterior vaginal wall and no active bleeding or discharge. Patient had abrupt onset of heavy vaginal bleeding on 02-17-12 while she was resting, soaking multiple pads and a towel prior to presenting to ED. She was hemodynamically stable and hemoglobin was down a gram from the day prior, to 10.9. [facility] MD spoke with on call MD for Dr Clifton James, with bleeding having subsided by that point and Monsel's solution used on the vaginal erosive lesion without packing required. She had first doxil on 01-31-12, then doxil + avastin on 02-27-12, planning avastin every 14 days with doxil every 28 days. At visit to Dr Clifton James in early Dec, there was obvious improvement in the vaginal involvement. She is to see Dr Clifton James monthly prior to treatments. Last CT CAP was 12-21-11.  The previous problem with urinary hesitancy and difficulty voiding has resolved and patient stopped Rapaflo 10 days ago. She now has urinary incontinence in afternoons after she has been active during day; she denies dysuria or other symptoms of infection. She is able to empty bladder and does not have any continuous leakage. She feels some deep pelvic pressure "at bottom of vagina" when she stands. She does not have frank pain. Bowels are moving. She  has had no skin reaction with the doxil. She was tired for just a couple of days after most recent doxil, but no significant nausea. She denies SOB, cough or  chest pain. Remainder of 10 point Review of Systems negative.  Objective:  Vital signs in last 24 hours:  BP 146/78  Pulse 73  Temp 96.7 F (35.9 C) (Oral)  Resp 18  Ht 5\' 2"  (1.575 m)  Wt 158 lb 3.2 oz (71.759 kg)  BMI 28.94 kg/m2  Weight is up 0.5 lb. Easily ambulatory, looks comfortable  HEENT:PERRLA, sclera clear, anicteric, oropharynx clear, no lesions and neck supple with midline trachea LymphaticsCervical, supraclavicular, and axillary nodes normal. Resp: clear to auscultation bilaterally and normal percussion bilaterally Cardio: regular rate and rhythm GI: soft, non-tender; bowel sounds normal; no masses,  no organomegaly Extremities: extremities normal, atraumatic, no cyanosis or edema Neuro:no sensory deficits noted Skin without erythema, irritation, excessive dryness Portacath -without erythema or tenderness  Lab Results:  Results for orders placed in visit on 04/03/12  CBC WITH DIFFERENTIAL      Component Value Range   WBC 4.6  3.9 - 10.3 10e3/uL   NEUT# 2.8  1.5 - 6.5 10e3/uL   HGB 12.8  11.6 - 15.9 g/dL   HCT 16.1  09.6 - 04.5 %   Platelets 170  145 - 400 10e3/uL   MCV 96.7  79.5 - 101.0 fL   MCH 32.4  25.1 - 34.0 pg   MCHC 33.5  31.5 - 36.0 g/dL   RBC 4.09  8.11 - 9.14 10e6/uL   RDW 13.4  11.2 - 14.5 %   lymph# 0.9  0.9 - 3.3 10e3/uL   MONO# 0.8  0.1 - 0.9 10e3/uL   Eosinophils Absolute 0.2  0.0 - 0.5 10e3/uL   Basophils Absolute 0.0  0.0 - 0.1 10e3/uL   NEUT% 59.6  38.4 - 76.8 %   LYMPH% 18.4  14.0 - 49.7 %   MONO% 17.3 (*) 0.0 - 14.0 %   EOS% 4.5  0.0 - 7.0 %   BASO% 0.2  0.0 - 2.0 %    UA available after visit trace LE, 0-2 RBC, 3-6 WBC, culture pending  Urine protein from 03-26-12  <30 Studies/Results:  No results found.  Medications: I have reviewed the patient's current medications.  I have suggested she try voiding at least every 2 hours while awake. Next appointment with Dr McDiarmid is in Jan.  Assessment/Plan: 1.Endometrial  carcinoma: progressive in vagina despite previous radiation and carbo/taxol, but responding to present doxil with avastin. She is post resection of solitary pulmonary met. She will have #4 doxil on Jan 7 and will continue qo wk avastin, next due 04-09-12. I will see her back coordinating with treatment in Jan, or sooner if needed 2.low pelvic pressure and some urinary incontinence: urine culture pending, off Rapaflo, known to Dr McDiarmid 3.PAC in 4.hx HTN, previously on lisinopril: BPs somewhat variable, but if increase on the avastin would resume lisinopril 5.hx osteoporosis     Constantinos Krempasky P, MD   04/03/2012, 12:03 PM

## 2012-04-04 ENCOUNTER — Other Ambulatory Visit: Payer: Self-pay | Admitting: Oncology

## 2012-04-05 ENCOUNTER — Telehealth: Payer: Self-pay | Admitting: *Deleted

## 2012-04-05 NOTE — Telephone Encounter (Signed)
Message copied by Phillis Knack on Fri Apr 05, 2012  4:12 PM ------      Message from: Lorine Bears      Created: Fri Apr 05, 2012  3:55 PM                   ----- Message -----         From: Reece Packer, MD         Sent: 04/03/2012   1:27 PM           To: Lorine Bears, RN            Labs seen and need follow up: please let her know urine specimen does not clearly show infection, so we will wait to see what the culture shows.

## 2012-04-05 NOTE — Telephone Encounter (Signed)
Left pt a message with Dr Precious Reel note below.

## 2012-04-08 ENCOUNTER — Telehealth: Payer: Self-pay

## 2012-04-08 NOTE — Telephone Encounter (Signed)
Message copied by Lorine Bears on Mon Apr 08, 2012  4:33 PM ------      Message from: Reece Packer      Created: Fri Apr 05, 2012  9:00 PM       Labs seen and need follow up: please let her know that she does not have urine infection by this culture

## 2012-04-08 NOTE — Telephone Encounter (Signed)
Told Ms. Phillipsurine culture negative as noted below.

## 2012-04-09 ENCOUNTER — Other Ambulatory Visit: Payer: Medicare Other | Admitting: Lab

## 2012-04-09 ENCOUNTER — Ambulatory Visit (HOSPITAL_BASED_OUTPATIENT_CLINIC_OR_DEPARTMENT_OTHER): Payer: Medicare Other

## 2012-04-09 VITALS — BP 134/72 | HR 68 | Temp 96.6°F

## 2012-04-09 DIAGNOSIS — C549 Malignant neoplasm of corpus uteri, unspecified: Secondary | ICD-10-CM

## 2012-04-09 DIAGNOSIS — C7982 Secondary malignant neoplasm of genital organs: Secondary | ICD-10-CM

## 2012-04-09 DIAGNOSIS — Z5112 Encounter for antineoplastic immunotherapy: Secondary | ICD-10-CM

## 2012-04-09 DIAGNOSIS — C541 Malignant neoplasm of endometrium: Secondary | ICD-10-CM

## 2012-04-09 DIAGNOSIS — C7800 Secondary malignant neoplasm of unspecified lung: Secondary | ICD-10-CM

## 2012-04-09 MED ORDER — HEPARIN SOD (PORK) LOCK FLUSH 100 UNIT/ML IV SOLN
500.0000 [IU] | Freq: Once | INTRAVENOUS | Status: AC | PRN
  Administered 2012-04-09: 500 [IU]
  Filled 2012-04-09: qty 5

## 2012-04-09 MED ORDER — SODIUM CHLORIDE 0.9 % IV SOLN
Freq: Once | INTRAVENOUS | Status: AC
  Administered 2012-04-09: 09:00:00 via INTRAVENOUS

## 2012-04-09 MED ORDER — SODIUM CHLORIDE 0.9 % IV SOLN
10.0000 mg/kg | Freq: Once | INTRAVENOUS | Status: AC
  Administered 2012-04-09: 725 mg via INTRAVENOUS
  Filled 2012-04-09: qty 29

## 2012-04-09 MED ORDER — SODIUM CHLORIDE 0.9 % IJ SOLN
10.0000 mL | INTRAMUSCULAR | Status: DC | PRN
  Administered 2012-04-09: 10 mL
  Filled 2012-04-09: qty 10

## 2012-04-09 NOTE — Patient Instructions (Addendum)
[facility] Discharge Instructions for Patients Receiving Chemotherapy  Today you received the following chemotherapy agents avastin  To help prevent nausea and vomiting after your treatment, we encourage you to take your nausea medication as needed  If you develop nausea and vomiting that is not controlled by your nausea medication, call the clinic. If it is after clinic hours your family physician or the after hours number for the clinic or go to the Emergency Department.   BELOW ARE SYMPTOMS THAT SHOULD BE REPORTED IMMEDIATELY:  *FEVER GREATER THAN 100.5 F  *CHILLS WITH OR WITHOUT FEVER  NAUSEA AND VOMITING THAT IS NOT CONTROLLED WITH YOUR NAUSEA MEDICATION  *UNUSUAL SHORTNESS OF BREATH  *UNUSUAL BRUISING OR BLEEDING  TENDERNESS IN MOUTH AND THROAT WITH OR WITHOUT PRESENCE OF ULCERS  *URINARY PROBLEMS  *BOWEL PROBLEMS  UNUSUAL RASH Items with * indicate a potential emergency and should be followed up as soon as possible.  The clinic phone number is [phone].   I have been informed and understand all the instructions given to me. I know to contact the clinic, my physician, or go to the Emergency Department if any problems should occur. I do not have any questions at this time, but understand that I may call the clinic during office hours   should I have any questions or need assistance in obtaining follow up care.    __________________________________________  _____________  __________ Signature of Patient or Authorized Representative            Date                   Time    __________________________________________ Nurse's Signature

## 2012-04-23 ENCOUNTER — Ambulatory Visit (HOSPITAL_BASED_OUTPATIENT_CLINIC_OR_DEPARTMENT_OTHER): Payer: Medicare Other

## 2012-04-23 ENCOUNTER — Other Ambulatory Visit: Payer: Self-pay | Admitting: *Deleted

## 2012-04-23 ENCOUNTER — Other Ambulatory Visit (HOSPITAL_BASED_OUTPATIENT_CLINIC_OR_DEPARTMENT_OTHER): Payer: Medicare Other | Admitting: Lab

## 2012-04-23 VITALS — BP 150/71 | HR 67 | Temp 97.7°F

## 2012-04-23 DIAGNOSIS — C541 Malignant neoplasm of endometrium: Secondary | ICD-10-CM

## 2012-04-23 DIAGNOSIS — C549 Malignant neoplasm of corpus uteri, unspecified: Secondary | ICD-10-CM

## 2012-04-23 DIAGNOSIS — Z5112 Encounter for antineoplastic immunotherapy: Secondary | ICD-10-CM

## 2012-04-23 DIAGNOSIS — C7800 Secondary malignant neoplasm of unspecified lung: Secondary | ICD-10-CM

## 2012-04-23 LAB — COMPREHENSIVE METABOLIC PANEL (CC13)
AST: 18 U/L (ref 5–34)
Albumin: 3.4 g/dL — ABNORMAL LOW (ref 3.5–5.0)
Alkaline Phosphatase: 60 U/L (ref 40–150)
Glucose: 93 mg/dl (ref 70–99)
Potassium: 4.3 mEq/L (ref 3.5–5.1)
Sodium: 139 mEq/L (ref 136–145)
Total Protein: 6.9 g/dL (ref 6.4–8.3)

## 2012-04-23 LAB — CBC WITH DIFFERENTIAL/PLATELET
BASO%: 0.9 % (ref 0.0–2.0)
EOS%: 4.9 % (ref 0.0–7.0)
MCH: 32.3 pg (ref 25.1–34.0)
MCV: 97.2 fL (ref 79.5–101.0)
MONO%: 23.2 % — ABNORMAL HIGH (ref 0.0–14.0)
RBC: 3.56 10*6/uL — ABNORMAL LOW (ref 3.70–5.45)
RDW: 14.1 % (ref 11.2–14.5)
nRBC: 0 % (ref 0–0)

## 2012-04-23 MED ORDER — SODIUM CHLORIDE 0.9 % IV SOLN
10.0000 mg/kg | Freq: Once | INTRAVENOUS | Status: AC
  Administered 2012-04-23: 725 mg via INTRAVENOUS
  Filled 2012-04-23: qty 29

## 2012-04-23 MED ORDER — ONDANSETRON 8 MG/50ML IVPB (CHCC)
8.0000 mg | Freq: Once | INTRAVENOUS | Status: AC
  Administered 2012-04-23: 8 mg via INTRAVENOUS

## 2012-04-23 MED ORDER — SODIUM CHLORIDE 0.9 % IJ SOLN
10.0000 mL | INTRAMUSCULAR | Status: DC | PRN
  Administered 2012-04-23: 10 mL
  Filled 2012-04-23: qty 10

## 2012-04-23 MED ORDER — SODIUM CHLORIDE 0.9 % IV SOLN
Freq: Once | INTRAVENOUS | Status: AC
  Administered 2012-04-23: 10:00:00 via INTRAVENOUS

## 2012-04-23 MED ORDER — DOXORUBICIN HCL LIPOSOMAL CHEMO INJECTION 2 MG/ML
40.0000 mg/m2 | Freq: Once | INTRAVENOUS | Status: AC
  Administered 2012-04-23: 72 mg via INTRAVENOUS
  Filled 2012-04-23: qty 36

## 2012-04-23 MED ORDER — HEPARIN SOD (PORK) LOCK FLUSH 100 UNIT/ML IV SOLN
500.0000 [IU] | Freq: Once | INTRAVENOUS | Status: AC | PRN
  Administered 2012-04-23: 500 [IU]
  Filled 2012-04-23: qty 5

## 2012-04-23 MED ORDER — DEXAMETHASONE SODIUM PHOSPHATE 10 MG/ML IJ SOLN
10.0000 mg | Freq: Once | INTRAMUSCULAR | Status: AC
  Administered 2012-04-23: 10 mg via INTRAVENOUS

## 2012-04-23 NOTE — Patient Instructions (Addendum)
[facility] Discharge Instructions for Patients Receiving Chemotherapy  Today you received the following chemotherapy agents Avastin and Doxil.  To help prevent nausea and vomiting after your treatment, we encourage you to take your nausea medication as prescribed.   If you develop nausea and vomiting that is not controlled by your nausea medication, call the clinic. If it is after clinic hours your family physician or the after hours number for the clinic or go to the Emergency Department.   BELOW ARE SYMPTOMS THAT SHOULD BE REPORTED IMMEDIATELY:  *FEVER GREATER THAN 100.5 F  *CHILLS WITH OR WITHOUT FEVER  NAUSEA AND VOMITING THAT IS NOT CONTROLLED WITH YOUR NAUSEA MEDICATION  *UNUSUAL SHORTNESS OF BREATH  *UNUSUAL BRUISING OR BLEEDING  TENDERNESS IN MOUTH AND THROAT WITH OR WITHOUT PRESENCE OF ULCERS  *URINARY PROBLEMS  *BOWEL PROBLEMS  UNUSUAL RASH Items with * indicate a potential emergency and should be followed up as soon as possible.  Feel free to call the clinic you have any questions or concerns. The clinic phone number is [phone].   I have been informed and understand all the instructions given to me. I know to contact the clinic, my physician, or go to the Emergency Department if any problems should occur. I do not have any questions at this time, but understand that I may call the clinic during office hours   should I have any questions or need assistance in obtaining follow up care.    __________________________________________  _____________  __________ Signature of Patient or Authorized Representative            Date                   Time    __________________________________________ Nurse's Signature

## 2012-04-24 ENCOUNTER — Telehealth: Payer: Self-pay | Admitting: *Deleted

## 2012-04-24 NOTE — Telephone Encounter (Signed)
Spoke with pt and asked her to follow up with Dr Clifton James to see if Dr Clifton James wants her to see her or have her go to ED. Pt will call Dr Clifton James.

## 2012-04-24 NOTE — Telephone Encounter (Signed)
Pt called stating she has been having vaginal bleeding for an hour and a half. At first was large clots, has tapered off. Pt has rested with legs elevated X 30 minutes. Wants to know what to do.

## 2012-04-25 ENCOUNTER — Telehealth: Payer: Self-pay

## 2012-04-25 NOTE — Telephone Encounter (Signed)
Ms. Grand stated that Dr. Clifton James told her to put her legs up in the recliner and the bleeding should subside.  The bleeding stopped about 2200.  She has not had any bleeding since.  She just feels a little draggy.   She appreciated the call.

## 2012-04-26 ENCOUNTER — Telehealth: Payer: Self-pay

## 2012-04-26 ENCOUNTER — Other Ambulatory Visit: Payer: Self-pay | Admitting: Oncology

## 2012-04-26 ENCOUNTER — Other Ambulatory Visit (HOSPITAL_BASED_OUTPATIENT_CLINIC_OR_DEPARTMENT_OTHER): Payer: Medicare Other | Admitting: Lab

## 2012-04-26 DIAGNOSIS — C541 Malignant neoplasm of endometrium: Secondary | ICD-10-CM

## 2012-04-26 DIAGNOSIS — C549 Malignant neoplasm of corpus uteri, unspecified: Secondary | ICD-10-CM

## 2012-04-26 DIAGNOSIS — D539 Nutritional anemia, unspecified: Secondary | ICD-10-CM

## 2012-04-26 LAB — CBC WITH DIFFERENTIAL/PLATELET
Basophils Absolute: 0 10*3/uL (ref 0.0–0.1)
EOS%: 3 % (ref 0.0–7.0)
Eosinophils Absolute: 0.2 10*3/uL (ref 0.0–0.5)
HCT: 35.9 % (ref 34.8–46.6)
HGB: 12.2 g/dL (ref 11.6–15.9)
MCH: 32.8 pg (ref 25.1–34.0)
MCV: 96.5 fL (ref 79.5–101.0)
NEUT#: 3.6 10*3/uL (ref 1.5–6.5)
NEUT%: 63.4 % (ref 38.4–76.8)
RDW: 14.1 % (ref 11.2–14.5)
lymph#: 1 10*3/uL (ref 0.9–3.3)

## 2012-04-26 LAB — PROTIME-INR
INR: 1 — ABNORMAL LOW (ref 2.00–3.50)
Protime: 12 Seconds (ref 10.6–13.4)

## 2012-04-26 NOTE — Telephone Encounter (Signed)
Told Ms. Dao that her HGB is good at 12.2 and her Pt/INR US WNL at 1.0 per Dr. Darrold Span.  If she has bleeding that does not stop, she would need to go to ED or call Dr. Gibson Ramp office.   She may need vaginal packing.  Pt. Verbalized understanding.

## 2012-04-26 NOTE — Telephone Encounter (Addendum)
[name] had vaginal bleeding last night.  She went through 3-4 pads.  The bleeding is very scant now. Ms. Serpas is tired.   Reviewed with Dr. Darrold Span and will have patient come in for cbc and PT/INR  today at 1330.

## 2012-05-04 ENCOUNTER — Other Ambulatory Visit: Payer: Self-pay | Admitting: Oncology

## 2012-05-07 ENCOUNTER — Other Ambulatory Visit: Payer: Self-pay

## 2012-05-07 ENCOUNTER — Ambulatory Visit (HOSPITAL_COMMUNITY)
Admission: RE | Admit: 2012-05-07 | Discharge: 2012-05-07 | Disposition: A | Discharge status: Home / Self Care | Payer: Medicare Other | Source: Ambulatory Visit | Attending: Oncology | Admitting: Oncology

## 2012-05-07 ENCOUNTER — Other Ambulatory Visit: Payer: Medicare Other | Admitting: Lab

## 2012-05-07 ENCOUNTER — Ambulatory Visit (HOSPITAL_BASED_OUTPATIENT_CLINIC_OR_DEPARTMENT_OTHER): Payer: Medicare Other | Admitting: Oncology

## 2012-05-07 ENCOUNTER — Other Ambulatory Visit: Payer: Self-pay | Admitting: Oncology

## 2012-05-07 ENCOUNTER — Other Ambulatory Visit (HOSPITAL_BASED_OUTPATIENT_CLINIC_OR_DEPARTMENT_OTHER): Payer: Medicare Other | Admitting: Lab

## 2012-05-07 ENCOUNTER — Encounter (HOSPITAL_COMMUNITY)
Admission: RE | Admit: 2012-05-07 | Discharge: 2012-05-07 | Disposition: A | Discharge status: Home / Self Care | Payer: Medicare Other | Source: Ambulatory Visit | Attending: Oncology | Admitting: Oncology

## 2012-05-07 ENCOUNTER — Ambulatory Visit (HOSPITAL_BASED_OUTPATIENT_CLINIC_OR_DEPARTMENT_OTHER): Payer: Medicare Other

## 2012-05-07 ENCOUNTER — Encounter: Payer: Self-pay | Admitting: Oncology

## 2012-05-07 VITALS — BP 130/68 | HR 103 | Temp 97.0°F | Resp 18 | Ht 62.0 in | Wt 150.5 lb

## 2012-05-07 VITALS — BP 122/57 | HR 84 | Temp 97.4°F | Resp 18

## 2012-05-07 DIAGNOSIS — I700 Atherosclerosis of aorta: Secondary | ICD-10-CM | POA: Insufficient documentation

## 2012-05-07 DIAGNOSIS — C549 Malignant neoplasm of corpus uteri, unspecified: Secondary | ICD-10-CM | POA: Insufficient documentation

## 2012-05-07 DIAGNOSIS — K7689 Other specified diseases of liver: Secondary | ICD-10-CM | POA: Insufficient documentation

## 2012-05-07 DIAGNOSIS — C541 Malignant neoplasm of endometrium: Secondary | ICD-10-CM

## 2012-05-07 DIAGNOSIS — E860 Dehydration: Secondary | ICD-10-CM

## 2012-05-07 DIAGNOSIS — R1032 Left lower quadrant pain: Secondary | ICD-10-CM | POA: Insufficient documentation

## 2012-05-07 DIAGNOSIS — R0602 Shortness of breath: Secondary | ICD-10-CM | POA: Insufficient documentation

## 2012-05-07 DIAGNOSIS — Z90710 Acquired absence of both cervix and uterus: Secondary | ICD-10-CM | POA: Insufficient documentation

## 2012-05-07 DIAGNOSIS — N898 Other specified noninflammatory disorders of vagina: Secondary | ICD-10-CM | POA: Insufficient documentation

## 2012-05-07 DIAGNOSIS — K5730 Diverticulosis of large intestine without perforation or abscess without bleeding: Secondary | ICD-10-CM | POA: Insufficient documentation

## 2012-05-07 DIAGNOSIS — Z9079 Acquired absence of other genital organ(s): Secondary | ICD-10-CM | POA: Insufficient documentation

## 2012-05-07 DIAGNOSIS — N281 Cyst of kidney, acquired: Secondary | ICD-10-CM | POA: Insufficient documentation

## 2012-05-07 DIAGNOSIS — N289 Disorder of kidney and ureter, unspecified: Secondary | ICD-10-CM | POA: Insufficient documentation

## 2012-05-07 DIAGNOSIS — I2510 Atherosclerotic heart disease of native coronary artery without angina pectoris: Secondary | ICD-10-CM | POA: Insufficient documentation

## 2012-05-07 DIAGNOSIS — D649 Anemia, unspecified: Secondary | ICD-10-CM | POA: Insufficient documentation

## 2012-05-07 DIAGNOSIS — C7800 Secondary malignant neoplasm of unspecified lung: Secondary | ICD-10-CM

## 2012-05-07 DIAGNOSIS — C7982 Secondary malignant neoplasm of genital organs: Secondary | ICD-10-CM

## 2012-05-07 DIAGNOSIS — R911 Solitary pulmonary nodule: Secondary | ICD-10-CM | POA: Insufficient documentation

## 2012-05-07 LAB — CBC WITH DIFFERENTIAL/PLATELET
Basophils Absolute: 0 10*3/uL (ref 0.0–0.1)
Eosinophils Absolute: 0 10*3/uL (ref 0.0–0.5)
HGB: 9.7 g/dL — ABNORMAL LOW (ref 11.6–15.9)
MONO#: 1.4 10*3/uL — ABNORMAL HIGH (ref 0.1–0.9)
NEUT#: 10.6 10*3/uL — ABNORMAL HIGH (ref 1.5–6.5)
RBC: 3.05 10*6/uL — ABNORMAL LOW (ref 3.70–5.45)
RDW: 14.1 % (ref 11.2–14.5)
WBC: 12.4 10*3/uL — ABNORMAL HIGH (ref 3.9–10.3)
lymph#: 0.4 10*3/uL — ABNORMAL LOW (ref 0.9–3.3)
nRBC: 0 % (ref 0–0)

## 2012-05-07 LAB — COMPREHENSIVE METABOLIC PANEL (CC13)
ALT: 10 U/L (ref 0–55)
CO2: 20 mEq/L — ABNORMAL LOW (ref 22–29)
Calcium: 9 mg/dL (ref 8.4–10.4)
Chloride: 98 mEq/L (ref 98–107)
Creatinine: 1.2 mg/dL — ABNORMAL HIGH (ref 0.6–1.1)
Glucose: 157 mg/dl — ABNORMAL HIGH (ref 70–99)

## 2012-05-07 LAB — UA PROTEIN, DIPSTICK - CHCC: Protein, ur: 100 mg/dL

## 2012-05-07 MED ORDER — ACETAMINOPHEN 325 MG PO TABS
325.0000 mg | ORAL_TABLET | Freq: Once | ORAL | Status: AC
  Administered 2012-05-07: 325 mg via ORAL

## 2012-05-07 MED ORDER — SODIUM CHLORIDE 0.9 % IV SOLN
Freq: Once | INTRAVENOUS | Status: AC
  Administered 2012-05-07: 11:00:00 via INTRAVENOUS

## 2012-05-07 MED ORDER — IOHEXOL 300 MG/ML  SOLN
80.0000 mL | Freq: Once | INTRAMUSCULAR | Status: AC | PRN
  Administered 2012-05-07: 80 mL via INTRAVENOUS

## 2012-05-07 MED ORDER — ONDANSETRON 8 MG/50ML IVPB (CHCC)
8.0000 mg | Freq: Once | INTRAVENOUS | Status: AC
  Administered 2012-05-07: 8 mg via INTRAVENOUS

## 2012-05-07 NOTE — Patient Instructions (Signed)
No avastin now  We will give you IV fluids and at least one unit of blood. We will check chest Xray today and get CT scans this week. Dr Darrold Span will see you back on Friday 1-24.  Call before next visit if any significant bleeding or if shortness of breath is not better.

## 2012-05-07 NOTE — Patient Instructions (Signed)
Blood Transfusion Information WHAT IS A BLOOD TRANSFUSION? A transfusion is the replacement of blood or some of its parts. Blood is made up of multiple cells which provide different functions.  Red blood cells carry oxygen and are used for blood loss replacement.  White blood cells fight against infection.  Platelets control bleeding.  Plasma helps clot blood.  Other blood products are available for specialized needs, such as hemophilia or other clotting disorders. BEFORE THE TRANSFUSION  Who gives blood for transfusions?   You may be able to donate blood to be used at a later date on yourself (autologous donation).  Relatives can be asked to donate blood. This is generally not any safer than if you have received blood from a stranger. The same precautions are taken to ensure safety when a relative's blood is donated.  Healthy volunteers who are fully evaluated to make sure their blood is safe. This is blood bank blood. Transfusion therapy is the safest it has ever been in the practice of medicine. Before blood is taken from a donor, a complete history is taken to make sure that person has no history of diseases nor engages in risky social behavior (examples are intravenous drug use or sexual activity with multiple partners). The donor's travel history is screened to minimize risk of transmitting infections, such as malaria. The donated blood is tested for signs of infectious diseases, such as HIV and hepatitis. The blood is then tested to be sure it is compatible with you in order to minimize the chance of a transfusion reaction. If you or a relative donates blood, this is often done in anticipation of surgery and is not appropriate for emergency situations. It takes many days to process the donated blood. RISKS AND COMPLICATIONS Although transfusion therapy is very safe and saves many lives, the main dangers of transfusion include:   Getting an infectious disease.  Developing a  transfusion reaction. This is an allergic reaction to something in the blood you were given. Every precaution is taken to prevent this. The decision to have a blood transfusion has been considered carefully by your caregiver before blood is given. Blood is not given unless the benefits outweigh the risks. AFTER THE TRANSFUSION  Right after receiving a blood transfusion, you will usually feel much better and more energetic. This is especially true if your red blood cells have gotten low (anemic). The transfusion raises the level of the red blood cells which carry oxygen, and this usually causes an energy increase.  The nurse administering the transfusion will monitor you carefully for complications. HOME CARE INSTRUCTIONS  No special instructions are needed after a transfusion. You may find your energy is better. Speak with your caregiver about any limitations on activity for underlying diseases you may have. SEEK MEDICAL CARE IF:   Your condition is not improving after your transfusion.  You develop redness or irritation at the intravenous (IV) site. SEEK IMMEDIATE MEDICAL CARE IF:  Any of the following symptoms occur over the next 12 hours:  Shaking chills.  You have a temperature by mouth above 102 F (38.9 C), not controlled by medicine.  Chest, back, or muscle pain.  People around you feel you are not acting correctly or are confused.  Shortness of breath or difficulty breathing.  Dizziness and fainting.  You get a rash or develop hives.  You have a decrease in urine output.  Your urine turns a dark color or changes to pink, red, or brown. Any of the following   symptoms occur over the next 10 days:  You have a temperature by mouth above 102 F (38.9 C), not controlled by medicine.  Shortness of breath.  Weakness after normal activity.  The white part of the eye turns yellow (jaundice).  You have a decrease in the amount of urine or are urinating less often.  Your  urine turns a dark color or changes to pink, red, or brown. Document Released: 03/31/2000 Document Revised: 06/26/2011 Document Reviewed: 11/18/2007 [employer] Patient Information 2013 [employer].  

## 2012-05-07 NOTE — Progress Notes (Signed)
OFFICE PROGRESS NOTE   05/07/2012   Physicians:J.Osborne, E.Skinner, J.Kinard, K.Supple, M.Johnson, P.Gehrig, Merrie Roof [facility] thoracic surgery), S.MacDiarmid   INTERVAL HISTORY:   Patient is seen, together with friend, in scheduled follow up of her metastatic endometrial carcinoma for which she has been receiving doxil and avastin.  She has had recent significant vaginal bleeding and is feeling badly today, with fatigue, SOB and LLQ discomfort. She had some diarrhea this AM. She has had no fever, no vaginal bleeding since last week, no vomiting, poor po intake of fluids and food x several days.    Patient presented with postmenopausal bleeding and was referred to Dr.Elizabeth Skinner. She had stage 1 grade B2 endometrioid adenocarcinoma at surgery 02-18-2010, which was robotic hysterectomy/BSO/pelvic and right periaortic lymphadnectomy with bladder suspension. She had adjuvant brachytherapy by Dr.Kinard 3000cGy in 5 fractions. She did well until she was found to have a metastatic nodule in the anterior vaginal wall in fall 2012, with no evidence of other involvement. She was treated with external beam radiation therapy 02-20-11 thru 03-27-11 then intracavitary brachytherapy x4 thru 05-02-11 by Dr Roselind Messier. She had initial good shrinkage of the tumor nodule after radiation, then in April 2013 was found to have new ulcerations in distal vagina which were biopsy proven to be progressive disease. Repeat CT AP had no evidence of other metastatic disease; she was treated with carboplatin and weekly taxol from 08-29-11 thru 11-14-11, initially with improvement in vaginal bleeding, then progression on treatment. She had scans by Dr Clifton James without evidence of distant metastatic disease and was referred to [facility] gyn oncology for anterior exenteration. Repeat CT/PET at [facility] on 12-15-11 reportedly showed a 1 cm FDG avid RUL pulmonary nodule and non-FDG avid 1.4 cm paratracheal and 1.6 cm periaortic nodes. She went to right  VATS with upper lobe wedge resection at [facility] by Dr Merrie Roof on 01-03-12. Pathology from [facility] [phone]) had multifocal metastatic adenocarcinoma consistent with endometrial primary with clear margins and negative level 7 and level 4R lymph nodes. Treatment was begun with single agent doxil on 01-30-13, with total 4 cycles doxil to date (10-16, 11-12, 03-26-12 and 04-23-12): we obtained financial clearance for avastin, which was added on q 2 week schedule beginning 02-27-12 (11-12, 11-26, 12-10, 04-09-12 and 04-23-12). Patient had one episode of significant vaginal bleeding in Nov prior to starting avastin, treated at [facility], Monsel's solution used on the vaginal erosive lesion without packing required then. The vaginal involvement has progressively improved with doxil/avastin, patient last seen by Dr Clifton James 04-18-2012 with further improvement in vaginal involvement. She has not had repeat CT chest since VATS in Sept. She has had at least 3 episodes of heavy vaginal bleeding (1-8, 1-9 and again in last ~ 5 days, I believe twice). She spoke with Dr Clifton James ~ 1-8 and bleeding stopped after she lay down with legs elevated. We checked CBC at this office on 04-26-12, Hgb 12.2 and INR normal. She did not contact physician with most recent episode(s) of bleeding, which she reports as very heavy with large clots.  As above, last avastin, with doxil, was 04-23-12.  Patient is weak and SOB with exertion to point that she is in [facility] at office now. She has had very little po in past few days. She is voiding "very little". She has had no vaginal bleeding in >24 hours and no other bleeding. She has LLQ pain new in past few days. Bowels have moved in last 24 hours. She has not had fever.  She has not been vomiting. She denies cough or chest pain. She has not had LE or abdominal swelling. She slept poorly last pm, uncomfortable. Remainder of 10 point Review of Systems negative/ unchanged..  Objective:  Vital signs in last 24  hours:  BP 130/68  Pulse 103  Temp 97 F (36.1 C) (Oral)  Resp 18  Ht 5\' 2"  (1.575 m)  Wt 150 lb 8 oz (68.266 kg)  BMI 27.53 kg/m2 This weight is down ~8 lbs from midDec. She looks weak, pale, slightly dyspneic, not in obvious pain. She is alert and oriented.  HEENT:PERRLA, sclera clear, anicteric and oropharynx clear, no lesions Oral mucosa somewhat dry LymphaticsCervical, supraclavicular, and axillary nodes normal. Resp: clear to auscultation bilaterally Cardio: regular rate and rhythm, tachy, no gallop GI: soft, not distended, slightly tender LLQ without mass appreciated, few bowel sounds, no appreciable HSM. Extremities: extremities normal, atraumatic, no cyanosis or edema Neuro:no focal deficits on exam just in [facility] Skin without rash, erythema or desquamation.  Portacath-without erythema or tenderness  Lab Results:  Results for orders placed during the hospital encounter of 05/07/12  PREPARE RBC (CROSSMATCH)      Component Value Range   Order Confirmation ORDER PROCESSED BY BLOOD BANK    TYPE AND SCREEN      Component Value Range   ABO/RH(D) O POS     Antibody Screen NEG     Sample Expiration 05/10/2012     Unit Number [id number]     Blood Component Type RBC LR PHER1     Unit division 00     Status of Unit ISSUED     Transfusion Status OK TO TRANSFUSE     Crossmatch Result Compatible      CBC today WBC 12.4/ ANC 10.6 (no gCSF), hemoglobin down to 9.7 from 12.2 on 04-26-12, plt 211.  UA protein today 100, having been negative or <30 with avastin treatments previously.  CMET has Na 130, K 4.0, Cl 98, glu 157, BUN 21, creat 1.2 (0.7 on 04-23-12), alb 2.7, T.prot 7.0  Studies/Results: Patient sent for CXR and also had urgent CT CAP today. CXR resulted prior to patient leaving office with postsurgical changes on right otherwise negative.  CT CAP report available after visit shows no progressive metastatic disease including no pulmonary nodules, no adenopathy, still  thickening in vaginal area. She does have significant diverticular changes without CT evidence of acute diverticulitis.  Medications: I have reviewed the patient's current medications.  Patient was transfused 1 unit of PRBCs and given IV NS here today. She looked clearly better by completion of those. Husband here by completion of interventions today and I have spoken with him also.  Assessment/Plan: 1.Endometrial carcinoma progressive in vagina despite previous maximal radiation and carbo/taxol, clinically responding to doxil + avastin, and post resection of isolated pulmonary met:  Several episodes of vaginal bleeding, with hemoglobin down 2.5 grams since 04-26-12. Hold avastin today. transfuse 1 unit PRBCs, restaging CTs now. We will see her back on 05-10-12 or sooner if needed, CBC and CMET. If she is otherwise ok, we may be able to proceed with doxil only on  Feb 4. 2.LLQ pain and elevated WBC: CT as above, see next phone note. 3.PAC in 4.hx HTN, BP not elevated today (which we have been watching with avastin off antihypertensives). She is somewhat dehydrated, IV NS given as above, BP 122/57 prior to leaving office 5.creatinine slightly elevated, IV NS as above, note radiology ok'd for contrast.  Time spent  including coordination of care 50+ min.  Cordelle Dahmen P, MD   05/07/2012, 1:47 PM

## 2012-05-08 ENCOUNTER — Telehealth: Payer: Self-pay | Admitting: Oncology

## 2012-05-08 ENCOUNTER — Other Ambulatory Visit: Payer: Self-pay | Admitting: *Deleted

## 2012-05-08 DIAGNOSIS — C541 Malignant neoplasm of endometrium: Secondary | ICD-10-CM

## 2012-05-08 LAB — TYPE AND SCREEN
ABO/RH(D): O POS
Antibody Screen: NEGATIVE

## 2012-05-08 MED ORDER — AMOXICILLIN-POT CLAVULANATE 500-125 MG PO TABS: ORAL_TABLET | ORAL | Status: DC

## 2012-05-08 NOTE — Telephone Encounter (Signed)
Medical Oncology  CT CAP from 05-07-12 reviewed and discussed with patient by phone now. Still thickening in vagina, but otherwise nothing that appears to be progression of the cancer, including lungs and abdomen/pelvis. She does have significant diverticular changes without CT evidence of acute diverticulitis. Patient slept last pm, has had no further vaginal bleeding and the shortness of breath is better, tho she is still tired. She still has LLQ pain. Last BM was diarrhea yesterday AM. She is known to Dr Danise Edge, denies any treatment for diverticulitis previously. I then called and discussed with her primary physician Dr Earl Gala, who is definitely in favor of treating for presumed diverticular disease, suggests 7-10 days Augmentin, to start with 7 day.  I spoke back to patient, who is in agreement with this plan. Will send script for Augmentin 500 mg q 12 hrs with food to [facility] now. Patient instructed on the timing and need to take this script with food. She will begin this AM. She understands that she needs to call if increased pain or other symptoms there, fever, any bleeding etc prior to next apt at this office 05-10-12. Patient appreciated calls. Cc CT report to Drs Earl Gala and [name].  Ila Mcgill, MD

## 2012-05-08 NOTE — Progress Notes (Signed)
Prescription for augmentin called into pharmacy

## 2012-05-09 ENCOUNTER — Telehealth: Payer: Self-pay | Admitting: Oncology

## 2012-05-09 NOTE — Telephone Encounter (Signed)
s/w husband and he is aware of the appt change for 1/24     anne

## 2012-05-10 ENCOUNTER — Ambulatory Visit (HOSPITAL_BASED_OUTPATIENT_CLINIC_OR_DEPARTMENT_OTHER): Payer: Medicare Other | Admitting: Physician Assistant

## 2012-05-10 ENCOUNTER — Telehealth: Payer: Self-pay | Admitting: Oncology

## 2012-05-10 ENCOUNTER — Ambulatory Visit: Payer: Medicare Other | Admitting: Oncology

## 2012-05-10 ENCOUNTER — Encounter: Payer: Self-pay | Admitting: Physician Assistant

## 2012-05-10 ENCOUNTER — Other Ambulatory Visit (HOSPITAL_BASED_OUTPATIENT_CLINIC_OR_DEPARTMENT_OTHER): Payer: Medicare Other | Admitting: Lab

## 2012-05-10 ENCOUNTER — Ambulatory Visit (HOSPITAL_COMMUNITY)
Admission: RE | Admit: 2012-05-10 | Discharge: 2012-05-10 | Disposition: A | Discharge status: Home / Self Care | Payer: Medicare Other | Source: Ambulatory Visit | Attending: Physician Assistant | Admitting: Physician Assistant

## 2012-05-10 VITALS — BP 130/80 | HR 70 | Temp 96.6°F | Resp 20 | Ht 62.0 in | Wt 151.7 lb

## 2012-05-10 DIAGNOSIS — C541 Malignant neoplasm of endometrium: Secondary | ICD-10-CM

## 2012-05-10 DIAGNOSIS — R1032 Left lower quadrant pain: Secondary | ICD-10-CM | POA: Insufficient documentation

## 2012-05-10 DIAGNOSIS — K5792 Diverticulitis of intestine, part unspecified, without perforation or abscess without bleeding: Secondary | ICD-10-CM

## 2012-05-10 DIAGNOSIS — C549 Malignant neoplasm of corpus uteri, unspecified: Secondary | ICD-10-CM

## 2012-05-10 DIAGNOSIS — K5730 Diverticulosis of large intestine without perforation or abscess without bleeding: Secondary | ICD-10-CM | POA: Insufficient documentation

## 2012-05-10 DIAGNOSIS — C7982 Secondary malignant neoplasm of genital organs: Secondary | ICD-10-CM

## 2012-05-10 DIAGNOSIS — C7800 Secondary malignant neoplasm of unspecified lung: Secondary | ICD-10-CM

## 2012-05-10 LAB — CBC WITH DIFFERENTIAL/PLATELET
BASO%: 0.2 % (ref 0.0–2.0)
HCT: 28.8 % — ABNORMAL LOW (ref 34.8–46.6)
MCHC: 33.8 g/dL (ref 31.5–36.0)
MONO#: 0.8 10*3/uL (ref 0.1–0.9)
NEUT#: 5.9 10*3/uL (ref 1.5–6.5)
NEUT%: 82.5 % — ABNORMAL HIGH (ref 38.4–76.8)
RBC: 3 10*6/uL — ABNORMAL LOW (ref 3.70–5.45)
WBC: 7.1 10*3/uL (ref 3.9–10.3)
lymph#: 0.3 10*3/uL — ABNORMAL LOW (ref 0.9–3.3)

## 2012-05-10 LAB — BASIC METABOLIC PANEL (CC13)
CO2: 24 mEq/L (ref 22–29)
Chloride: 102 mEq/L (ref 98–107)
Sodium: 136 mEq/L (ref 136–145)

## 2012-05-10 NOTE — Patient Instructions (Addendum)
Follow up with Dr. Earl Gala as scheduled regarding your diverticulitis Follow up with Dr. Darrold Span in 2 weeks as scheduled

## 2012-05-10 NOTE — Progress Notes (Addendum)
OFFICE PROGRESS NOTE   05/10/2012   Physicians:J.Osborne, E.Skinner, J.Kinard, K.Supple, M.Merle Cirelli, P.Gehrig, Immunologist [facility] thoracic surgery), S.MacDiarmid   INTERVAL HISTORY:   Patient is seen, together with her husband, in scheduled follow up of her metastatic endometrial carcinoma for which she has been receiving doxil and avastin. Today she comes for followup after having been transfused earlier this week and given some IV fluids. She was started on Augmentin for diverticulitis. She initially had some diarrhea on Tuesday but reports that she's not had a bowel movement in the last couple of days. She is not having any further vaginal bleeding. Her by mouth intake has continued to be decreased. She does report however significant lower abdominal pain making it difficult to get in and out of bed and change position.  Patient presented with postmenopausal bleeding and was referred to Dr.Elizabeth Skinner. She had stage 1 grade B2 endometrioid adenocarcinoma at surgery 02-18-2010, which was robotic hysterectomy/BSO/pelvic and right periaortic lymphadnectomy with bladder suspension. She had adjuvant brachytherapy by Dr.Kinard 3000cGy in 5 fractions. She did well until she was found to have a metastatic nodule in the anterior vaginal wall in fall 2012, with no evidence of other involvement. She was treated with external beam radiation therapy 02-20-11 thru 03-27-11 then intracavitary brachytherapy x4 thru 05-02-11 by Dr Roselind Messier. She had initial good shrinkage of the tumor nodule after radiation, then in April 2013 was found to have new ulcerations in distal vagina which were biopsy proven to be progressive disease. Repeat CT AP had no evidence of other metastatic disease; she was treated with carboplatin and weekly taxol from 08-29-11 thru 11-14-11, initially with improvement in vaginal bleeding, then progression on treatment. She had scans by Dr Clifton James without evidence of distant metastatic disease and was  referred to [facility] gyn oncology for anterior exenteration. Repeat CT/PET at [facility] on 12-15-11 reportedly showed a 1 cm FDG avid RUL pulmonary nodule and non-FDG avid 1.4 cm paratracheal and 1.6 cm periaortic nodes. She went to right VATS with upper lobe wedge resection at [facility] by Dr Merrie Roof on 01-03-12. Pathology from [facility] [phone]) had multifocal metastatic adenocarcinoma consistent with endometrial primary with clear margins and negative level 7 and level 4R lymph nodes. Treatment was begun with single agent doxil on 01-30-13, with total 4 cycles doxil to date (10-16, 11-12, 03-26-12 and 04-23-12): we obtained financial clearance for avastin, which was added on q 2 week schedule beginning 02-27-12 (11-12, 11-26, 12-10, 04-09-12 and 04-23-12). Patient had one episode of significant vaginal bleeding in Nov prior to starting avastin, treated at [facility], Monsel's solution used on the vaginal erosive lesion without packing required then. The vaginal involvement has progressively improved with doxil/avastin, patient last seen by Dr Clifton James 04-18-2012 with further improvement in vaginal involvement. She has not had repeat CT chest since VATS in Sept. She has had at least 3 episodes of heavy vaginal bleeding (1-8, 1-9 and again in last ~ 5 days, I believe twice). She spoke with Dr Clifton James ~ 1-8 and bleeding stopped after she lay down with legs elevated. We checked CBC at this office on 04-26-12, Hgb 12.2 and INR normal. She did not contact physician with most recent episode(s) of bleeding, which she reports as very heavy with large clots.  As above, last avastin, with doxil, was 04-23-12.  Compared to earlier this week she does feel a bit stronger however remains in wheelchair as she presents to the office today. She denies any shortness of breath, fever or chills.  She does report a rash that developed shortly after her last cycle of chemotherapy it has relegated to the inside of the knees bilaterally and she's been using  Neosporin ointment on it. She states the rash does not itch at all. She is drinking fluids a bit better. She denies any further vaginal bleeding. The abdominal pain is lower mid abdominal region towards the left lower abdomen. She states she feels a "fullness and pressure".  She has not been vomiting. She denies cough or chest pain. She has not had LE or abdominal swelling. She has not tried anything for pain. She does have some oxycodone at home but was reluctant to take this because it may cause her some constipation. She has not tried Tylenol or any other pain medications.  Remainder of 10 point Review of Systems negative/ unchanged..  Objective:  Vital signs in last 24 hours:  BP 130/80  Pulse 70  Temp 96.6 F (35.9 C) (Oral)  Resp 20  Ht 5\' 2"  (1.575 m)  Wt 151 lb 11.2 oz (68.811 kg)  BMI 27.75 kg/m2 Weight is stable. She has difficulty arising from the wheelchair and getting onto the examination table due to her lower abdominal pain. l She is alert and oriented.  HEENT:PERRLA, sclera clear, anicteric and oropharynx clear, no lesions Oral mucosa somewhat dry LymphaticsCervical, supraclavicular, and axillary nodes normal. Resp: clear to auscultation bilaterally Cardio: regular rate and rhythm, no gallop or rub GI: soft, not distended, there is moderate lower mid abdominal to left lower quadrant pain with a firm "ridge" palpable in this area. There is no rebound or referred pain. Bowel sounds are present and normally active in all quadrants.  no appreciable HSM. Extremities: extremities normal, atraumatic, no cyanosis or edema Neuro:no focal deficits on exam  Skin knee left medial knee there is an approximately 2 cm round red dry skin eruption on the right medial knee region there is a less than a centimeter area of similar redness and dryness. There is no fluid or exudate present. No signs of superinfection.  Portacath-without erythema or tenderness  Lab Results:  Results for orders  placed in visit on 05/07/12  COMPREHENSIVE METABOLIC PANEL (CC13)      Component Value Range   Sodium 130 (*) 136 - 145 mEq/L   Potassium 4.0  3.5 - 5.1 mEq/L   Chloride 98  98 - 107 mEq/L   CO2 20 (*) 22 - 29 mEq/L   Glucose 157 (*) 70 - 99 mg/dl   BUN 16.1  7.0 - 09.6 mg/dL   Creatinine 1.2 (*) 0.6 - 1.1 mg/dL   Total Bilirubin 0.45  0.20 - 1.20 mg/dL   Alkaline Phosphatase 64  40 - 150 U/L   AST 15  5 - 34 U/L   ALT 10  0 - 55 U/L   Total Protein 7.0  6.4 - 8.3 g/dL   Albumin 2.7 (*) 3.5 - 5.0 g/dL   Calcium 9.0  8.4 - 40.9 mg/dL    CBC today WBC 81.1/ ANC 10.6 (no gCSF), hemoglobin down to 9.7 from 12.2 on 04-26-12, plt 211.  UA protein today 100, having been negative or <30 with avastin treatments previously.  CMET has Na 130, K 4.0, Cl 98, glu 157, BUN 21, creat 1.2 (0.7 on 04-23-12), alb 2.7, T.prot 7.0  Studies/Results: Patient sent for CXR and also had urgent CT CAP today. CXR resulted prior to patient leaving office with postsurgical changes on right otherwise negative.  CT CAP report  available after visit shows no progressive metastatic disease including no pulmonary nodules, no adenopathy, still thickening in vaginal area. She does have significant diverticular changes without CT evidence of acute diverticulitis.  Medications: I have reviewed the patient's current medications.  Patient was discussed with Dr. Darrold Span  Assessment/Plan: 1.Endometrial carcinoma progressive in vagina despite previous maximal radiation and carbo/taxol, clinically responding to doxil + avastin, and post resection of isolated pulmonary met: Vaginal bleeding has subsided and the hemoglobin is stable at 9.7 g/dL. She'll followup with Dr. Darrold Span on 05/21/2012 with a repeat CBC differential and stat C. met. A decision will be made at that time regarding moving forward with the next cycle of Doxil  2.LLQ pain and elevated WBC: CT as above. Patient currently on Augmentin for diverticulitis. However the  increased abdominal pain is a bit concerning. Patient will be sent for an acute abdomen x-ray which revealed no acute process. She'll followup with Dr. Earl Gala the week of 05/13/2012 regarding the diverticulitis. He may also have her see Dr. Danise Edge her gastroenterologist if indicated. Patient is informed that she may take Tylenol and alternate with relief of the next couple of days for pain. Should the pain increased she is advised to the emergency room for further evaluation and management. Her creatinine has returned to baseline of 0.8 today. She is encouraged to continue to push by mouth fluids and increase her by mouth intake. 3.PAC in 4.hx HTN, BP not elevated today  5. Rash, likely secondary to Doxil. This is an area where the knees press together. Patient was advised to discontinue the use of Neosporin and use a topical emollient of choice such as Aveeno or Eucerin. Patient voiced understanding  Time spent including coordination of care 40+ min.  Conni Slipper, PA-C   05/10/2012, 2:38 PM

## 2012-05-10 NOTE — Telephone Encounter (Signed)
appts made and printed for pt,per pt she already has an appt with dr Earl Gala on 1/28 at 10:00,adrena aware

## 2012-05-21 ENCOUNTER — Other Ambulatory Visit (HOSPITAL_BASED_OUTPATIENT_CLINIC_OR_DEPARTMENT_OTHER): Payer: Medicare Other | Admitting: Lab

## 2012-05-21 ENCOUNTER — Telehealth: Payer: Self-pay | Admitting: *Deleted

## 2012-05-21 ENCOUNTER — Encounter: Payer: Self-pay | Admitting: Oncology

## 2012-05-21 ENCOUNTER — Other Ambulatory Visit: Payer: Self-pay

## 2012-05-21 ENCOUNTER — Ambulatory Visit (HOSPITAL_BASED_OUTPATIENT_CLINIC_OR_DEPARTMENT_OTHER): Payer: Medicare Other | Admitting: Oncology

## 2012-05-21 ENCOUNTER — Telehealth: Payer: Self-pay | Admitting: Oncology

## 2012-05-21 VITALS — BP 176/85 | HR 77 | Temp 97.4°F | Resp 18 | Ht 62.0 in | Wt 150.6 lb

## 2012-05-21 DIAGNOSIS — C541 Malignant neoplasm of endometrium: Secondary | ICD-10-CM

## 2012-05-21 DIAGNOSIS — C7800 Secondary malignant neoplasm of unspecified lung: Secondary | ICD-10-CM

## 2012-05-21 DIAGNOSIS — C7982 Secondary malignant neoplasm of genital organs: Secondary | ICD-10-CM

## 2012-05-21 DIAGNOSIS — D6481 Anemia due to antineoplastic chemotherapy: Secondary | ICD-10-CM

## 2012-05-21 DIAGNOSIS — C549 Malignant neoplasm of corpus uteri, unspecified: Secondary | ICD-10-CM

## 2012-05-21 LAB — CBC WITH DIFFERENTIAL/PLATELET
Basophils Absolute: 0.1 10*3/uL (ref 0.0–0.1)
EOS%: 2.1 % (ref 0.0–7.0)
HCT: 30.6 % — ABNORMAL LOW (ref 34.8–46.6)
HGB: 10.4 g/dL — ABNORMAL LOW (ref 11.6–15.9)
LYMPH%: 12.3 % — ABNORMAL LOW (ref 14.0–49.7)
MCH: 32.8 pg (ref 25.1–34.0)
MCV: 96.3 fL (ref 79.5–101.0)
MONO%: 14.6 % — ABNORMAL HIGH (ref 0.0–14.0)
NEUT%: 69.8 % (ref 38.4–76.8)
Platelets: 302 10*3/uL (ref 145–400)

## 2012-05-21 LAB — COMPREHENSIVE METABOLIC PANEL (CC13)
AST: 14 U/L (ref 5–34)
Alkaline Phosphatase: 70 U/L (ref 40–150)
BUN: 15.3 mg/dL (ref 7.0–26.0)
Calcium: 9.6 mg/dL (ref 8.4–10.4)
Chloride: 103 mEq/L (ref 98–107)
Creatinine: 0.9 mg/dL (ref 0.6–1.1)

## 2012-05-21 MED ORDER — FERROUS GLUCONATE 324 (38 FE) MG PO TABS: 324.0000 mg | ORAL_TABLET | Freq: Every day | ORAL | Status: DC

## 2012-05-21 NOTE — Telephone Encounter (Signed)
gv pt Feb appt schedule to pt and emialed michelle to add tx....pt aware

## 2012-05-21 NOTE — Patient Instructions (Signed)
We will let your pharmacist know that you need iron:  Ferrous gluconate or ferrous fumarate. Try to take this an hour before meals or 2 hours after meals, with orange juice once daily.

## 2012-05-21 NOTE — Telephone Encounter (Signed)
Per staff message and POF I have scheduled appts.  JMW  

## 2012-05-21 NOTE — Progress Notes (Signed)
OFFICE PROGRESS NOTE   05/21/2012   Physicians:J.Osborne, E.Skinner, J.Kinard, K.Supple, M.Johnson, P.Gehrig, Merrie Roof [facility] thoracic surgery), S.MacDiarmid   INTERVAL HISTORY:   Patient is seen, alone for visit, in continuing attention to her metastatic endometrial cancer involving vagina and post resection of solitary pulmonary met. She has had improvement in the vaginal tumor with most recent doxil and avastin, but unfortunately has had recent significant vaginal bleeding and hematoma in rectus abdominus. Cycle 4 doxil and cycle 5 avastin were given 04-23-12.  Patient presented with postmenopausal vaginal bleeding, with stage 1 grade B2 endometrioid adenocarcinoma at surgery by Dr [name] 02-18-2010, which was robotic hysterectomy/BSO/pelvic and right periaortic lymphadnectomy with bladder suspension. She had adjuvant brachytherapy by Dr.Kinard 3000cGy in 5 fractions. She did well until she was found to have a metastatic nodule in the anterior vaginal wall in fall 2012, with no evidence of other involvement. She was treated with external beam radiation therapy 02-20-11 thru 03-27-11 then intracavitary brachytherapy x4 thru 05-02-11 by Dr Roselind Messier. She had initial good shrinkage of the tumor nodule after radiation, then in April 2013 was found to have new ulcerations in distal vagina which were biopsy proven to be progressive disease. Repeat CT AP had no evidence of other metastatic disease; she was treated with carboplatin and weekly taxol from 08-29-11 thru 11-14-11, initially with improvement in vaginal bleeding, then progression on treatment. She had scans by Dr Clifton James without evidence of distant metastatic disease and was referred to [facility] gyn oncology for anterior exenteration. [facility] CT[facility] on 12[facility]15-11 reportedly showed a 1 cm FDG avid RUL pulmonary nodule and non-FDG avid 1.4 cm paratracheal and 1.6 cm periaortic nodes. She went to right VATS with upper lobe wedge resection at [facility] by Dr  Merrie Roof on 01-03-12. Pathology from [facility] [phone]) had multifocal metastatic adenocarcinoma consistent with endometrial primary with clear margins and negative level 7 and level 4R lymph nodes. Treatment was begun with single agent doxil on 01-30-13, with total 4 cycles doxil to date (10-16, 11-12, 03-26-12 and 04-23-12): we obtained financial clearance for avastin, which was added on q 2 week schedule beginning 02-27-12 (11-12, 11-26, 12-10, 04-09-12 and 04-23-12). Patient had one episode of significant vaginal bleeding in Nov prior to starting avastin, treated at [facility], with Monsel's solution used on the vaginal erosive lesion without packing required then. The vaginal involvement has progressively improved with doxil/avastin, patient seen by Dr Clifton James ~ late Jan; I have not received her note from the last visit, tho patient reports that nothing was worse. She had at least 3 episodes of heavy vaginal bleeding in late Jan, requiring 1 unit PRBCs on 05-07-12 due to symptomatic anemia with hgb 9.7 from 12 on 04-26-12.   She had LLQ pain initially thought related to diverticular disease, but actually was from left rectus abdominus hematoma.  CT CAP done 05-07-12 did not show any progression in chest, diverticular changes and (on review) evidence of left rectus hematoma.   Patient is feeling progressively better, with improvement in LLQ pain and swelling, no further vaginal or other bleeding, energy and appetite better with improvement in taste in last few days. She has had no fever or symptoms of infection, no increased shortness of breath, no chest pain. The antibiotics for presumed diverticulitis were DCd with finding of the abdominal wall hematoma. Remainder of 10 point Review of Systems negative.  Objective:  Vital signs in last 24 hours:  BP 176/85  Pulse 77  Temp 97.4 F (36.3  C) (Oral)  Resp 18  Ht 5\' 2"  (1.575 m)  Wt 150 lb 9.6 oz (68.312 kg)  BMI 27.55 kg/m2 Weight is stable from 2  weeks ago. Much more easily mobile, still LLQ uncomfortable changing position on exam table   HEENT:PERRLA, extra ocular movement intact, sclera clear, anicteric and oropharynx clear, no lesions No complete alopecia LymphaticsCervical, supraclavicular, and axillary nodes normal. Resp: clear to auscultation bilaterally and normal percussion bilaterally. Surgical scars right chest well healed and not tender. Cardio: regular rate and rhythm GI: soft, non-tender; bowel sounds normal; no masses,  no organomegaly. Firm swelling left suprapubic area ~ 6 cm diameter,  Extremities: extremities normal, atraumatic, no cyanosis or edema Neuro:nonfocal Skin without rash or ecchymosis or petechiae. Dry on flanks, no hand foot changes noticeable now. Portacath-without erythema or tenderness. Lab Results:  Results for orders placed in visit on 05/21/12  CBC WITH DIFFERENTIAL      Component Value Range   WBC 5.0  3.9 - 10.3 10e3/uL   NEUT# 3.5  1.5 - 6.5 10e3/uL   HGB 10.4 (*) 11.6 - 15.9 g/dL   HCT 56.2 (*) 13.0 - 86.5 %   Platelets 302  145 - 400 10e3/uL   MCV 96.3  79.5 - 101.0 fL   MCH 32.8  25.1 - 34.0 pg   MCHC 34.1  31.5 - 36.0 g/dL   RBC 7.84 (*) 6.96 - 2.95 10e6/uL   RDW 15.1 (*) 11.2 - 14.5 %   lymph# 0.6 (*) 0.9 - 3.3 10e3/uL   MONO# 0.7  0.1 - 0.9 10e3/uL   Eosinophils Absolute 0.1  0.0 - 0.5 10e3/uL   Basophils Absolute 0.1  0.0 - 0.1 10e3/uL   NEUT% 69.8  38.4 - 76.8 %   LYMPH% 12.3 (*) 14.0 - 49.7 %   MONO% 14.6 (*) 0.0 - 14.0 %   EOS% 2.1  0.0 - 7.0 %   BASO% 1.2  0.0 - 2.0 %    CMET available after visit: Na 139, K 4.4, Cl 103, CO2 25, Glu 114, BUN 15.3, creat 0.9, T bili 0.2, AP 70, AST 14, ALT <6, T prot 6.9, alb 2.9, ca 9.6  Studies/Results: CT CAP 05-07-12 and addended 05-14-12   **ADDENDUM** CREATED: 05/14/2012 10:33:19  The original report was by Dr. Dr. Trudie Reed. The following  addendum is by Dr. Gaylyn Rong:  Dr. [name] telephoned me at 10:25 a.m.  on 05/14/2012 to  discuss this case. He notified me that the patient had been  experiencing left inguinal pain and there was clinical concern for  possible hernia.  Upon focused review of the inguinal region, there is new asymmetry  of the inferior most portion of the left rectus abdominus muscle,  with adjacent adipose stranding. Some of this stranding is chronic  and due to scarring, but I am suspicious that some is probably due  to a small acute left inferior rectus hematoma. Malignancy and  infection involving the rectus sheath are less likely differential  diagnostic considerations. I discussed this by telephone with Dr.  Earl Gala.  **END ADDENDUM** SIGNED BY: Soyla Murphy. Ova Freshwater, M.D.    *RADIOLOGY REPORT*   Comparison: PET CT 02/06/2011. Clinical Data: Metastatic endometrial cancer. Increased vaginal  bleeding. Shortness of breath. Left lower quadrant pain.  CT CHEST, ABDOMEN AND PELVIS WITH CONTRAST  CT CHEST   Mediastinum: Right internal jugular single lumen Port-A-Cath with  tip terminating in the superior cavoatrial junction. Heart size is  normal. There is no  significant pericardial fluid, thickening or  pericardial calcification. There is atherosclerosis of the thoracic  aorta, the great vessels of the mediastinum and the coronary  arteries, including calcified atherosclerotic plaque in the left  anterior descending coronary artery. No pathologically enlarged  mediastinal or hilar lymph nodes. Esophagus is unremarkable in  appearance. Incidental note is made of a separate origin of the  left vertebral artery directly off the aortic arch (normal  anatomical variant).  Lungs/Pleura: Compared to the prior examination there are new  postoperative changes of a right upper lobe wedge resection. No  soft tissue mass is noted along the suture line in the right upper  lobe to suggest recurrence of disease. 5 mm nodule in the  posterior aspect of the left lower lobe (image 36 of  series six) is  unchanged. No new suspicious appearing pulmonary nodules or masses  are identified. No acute consolidative airspace disease. No  pleural effusions.  Musculoskeletal: There are no aggressive appearing lytic or blastic  lesions noted in the visualized portions of the skeleton.  IMPRESSION:  1. No findings to suggest metastatic disease to the thorax.  2. Unchanged 5 mm nodule in the posterior aspect of the left lower  lobe compared to remote prior study from 02/06/2011. The stability  in size of this lesion over the prior 64-month time interval is  highly reassuring, and this is favored to be a benign lesion.  3. Atherosclerosis, including left anterior descending coronary  artery disease.  4. Status post wedge resection in the right upper lobe.  CT ABDOMEN AND PELVIS  Findings:  Abdomen/Pelvis: Multiple low attenuation hepatic lesions are noted,  largest of which measures only 11 mm in the superior aspect of  segment 7. This lesion is compatible with a small simple cyst,  while other lesions are too small to definitively characterize.  These are generally similar to prior examination 05/18/2007. The  appearance of the gallbladder, pancreas, spleen and bilateral  adrenal glands is unremarkable. Numerous small low attenuation  lesions are noted in the kidneys bilaterally, many of which are too  small to definitively characterize. In addition, there is a 2.1 cm  exophytic simple cyst in the upper pole of the left kidney, and a  very large 8.9 cm exophytic simple cyst in the lower pole of the  left kidney. Atherosclerosis of the abdominal and pelvic  vasculature, without evidence of aneurysm or dissection.  Postoperative changes of total abdominal hysterectomy and bilateral  salpingo-oophorectomy. No definite soft tissue masses are  identified within the abdomen or pelvis to suggest peritoneal  spread of disease. There are numerous colonic diverticula noted,  without  definite surrounding inflammatory changes to suggest acute  diverticulitis at this time. No ascites or pneumoperitoneum and no  pathologic distension of small bowel. No definite pathologic  lymphadenopathy identified within the abdomen or pelvis. There is  a small amount of gas in the vagina, and there appears to be  diffuse thickening of the vaginal walls in the inferior aspect of  the vagina near the introitus. Urinary bladder is nearly  completely decompressed, but otherwise unremarkable.  Musculoskeletal: There are no aggressive appearing lytic or blastic  lesions noted in the visualized portions of the skeleton.  IMPRESSION:  1. Marked thickening of the vaginal wall, particularly in the  region of the introitus, concerning for recurrent metastatic  disease to the vagina. Correlation with physical examination is  recommended.  2. No other definite signs of metastatic disease in the abdomen  or  pelvis.  3. Multiple low attenuation hepatic and renal lesions, largest of  which are compatible with simple cyst, while the smaller lesions  are too small to definitively characterize. These are similar to  prior examinations. For extensive colonic diverticulosis, without  findings to suggest acute diverticulitis at this time.  5. Extensive atherosclerosis.  6. Status post total abdominal hysterectomy and bilateral salpingo-  oophorectomy.      Medications: I have reviewed the patient's current medications. I have asked patient to begin oral ferrous fumarate or gluconate ~ 325 mg daily on empty stomach with OJ  I have told patient that I believe bleeding risk with further avastin is significant and I would not be in favor of continuing that drug. We can continue doxil for another 1-2 cycles as single agent, and she is in agreement with this. We will give cycle 5 doxil on 05-28-12 as long as she is otherwise stable and ANC >=1.5 and plt >=100k. I expect that Dr Gibson Ramp most recent note will  be available to me soon also.  Assessment/Plan: 1. Endometroid adenocarcinoma of uterus metastatic to vagina and lung: significant improvement in vaginal disease and no progression in lung with doxil + avastin as above, but bleeding complications with avastin. Will continue single agent doxil possibly for 2 more cycles. 2.vaginal bleeding and bleeding into rectus abdominus muscle without known trauma: complication of avastin,  improved.  3.diverticulosis by CT 4.PAC in 5.anemia related to bleeding and chemotherapy: post transfusion PRBCs 06-07-12. Begin oral iron. 6.Hx HTN   Patient was in agreement with plan above.  LIVESAY,LENNIS P, MD   05/21/2012, 2:22 PM

## 2012-05-22 ENCOUNTER — Telehealth: Payer: Self-pay | Admitting: *Deleted

## 2012-05-22 NOTE — Telephone Encounter (Signed)
Pt's husband called from [facility] pharmacy, had gone to pick up OTC iron tablets. Spoke with pharmacist and she was going to get Ferrous gluconate OTC for patient. Instructed to take 1 tablet daily with OJ-can take 1 hour before meals or 2 hours after meals.

## 2012-05-28 ENCOUNTER — Other Ambulatory Visit (HOSPITAL_BASED_OUTPATIENT_CLINIC_OR_DEPARTMENT_OTHER): Payer: Medicare Other | Admitting: Lab

## 2012-05-28 ENCOUNTER — Ambulatory Visit (HOSPITAL_BASED_OUTPATIENT_CLINIC_OR_DEPARTMENT_OTHER): Payer: Medicare Other

## 2012-05-28 VITALS — BP 138/83 | HR 79 | Temp 98.0°F

## 2012-05-28 DIAGNOSIS — C549 Malignant neoplasm of corpus uteri, unspecified: Secondary | ICD-10-CM

## 2012-05-28 DIAGNOSIS — Z5111 Encounter for antineoplastic chemotherapy: Secondary | ICD-10-CM

## 2012-05-28 DIAGNOSIS — C541 Malignant neoplasm of endometrium: Secondary | ICD-10-CM

## 2012-05-28 LAB — CBC WITH DIFFERENTIAL/PLATELET
Basophils Absolute: 0 10*3/uL (ref 0.0–0.1)
EOS%: 5.7 % (ref 0.0–7.0)
HCT: 33.5 % — ABNORMAL LOW (ref 34.8–46.6)
HGB: 10.9 g/dL — ABNORMAL LOW (ref 11.6–15.9)
MCH: 31.4 pg (ref 25.1–34.0)
NEUT%: 56.7 % (ref 38.4–76.8)
lymph#: 0.7 10*3/uL — ABNORMAL LOW (ref 0.9–3.3)

## 2012-05-28 MED ORDER — DOXORUBICIN HCL LIPOSOMAL CHEMO INJECTION 2 MG/ML
40.0000 mg/m2 | Freq: Once | INTRAVENOUS | Status: AC
  Administered 2012-05-28: 72 mg via INTRAVENOUS
  Filled 2012-05-28: qty 36

## 2012-05-28 MED ORDER — ONDANSETRON 8 MG/50ML IVPB (CHCC)
8.0000 mg | Freq: Once | INTRAVENOUS | Status: AC
  Administered 2012-05-28: 8 mg via INTRAVENOUS

## 2012-05-28 MED ORDER — SODIUM CHLORIDE 0.9 % IV SOLN
Freq: Once | INTRAVENOUS | Status: AC
  Administered 2012-05-28: 09:00:00 via INTRAVENOUS

## 2012-05-28 MED ORDER — SODIUM CHLORIDE 0.9 % IJ SOLN
10.0000 mL | INTRAMUSCULAR | Status: DC | PRN
  Administered 2012-05-28: 10 mL
  Filled 2012-05-28: qty 10

## 2012-05-28 MED ORDER — DEXAMETHASONE SODIUM PHOSPHATE 10 MG/ML IJ SOLN
10.0000 mg | Freq: Once | INTRAMUSCULAR | Status: AC
  Administered 2012-05-28: 10 mg via INTRAVENOUS

## 2012-05-28 MED ORDER — HEPARIN SOD (PORK) LOCK FLUSH 100 UNIT/ML IV SOLN
500.0000 [IU] | Freq: Once | INTRAVENOUS | Status: AC | PRN
  Administered 2012-05-28: 500 [IU]
  Filled 2012-05-28: qty 5

## 2012-05-28 NOTE — Patient Instructions (Addendum)
[facility] Discharge Instructions for Patients Receiving Chemotherapy  Today you received the following chemotherapy agents doxil  To help prevent nausea and vomiting after your treatment, we encourage you to take your nausea medication as prescribed.    If you develop nausea and vomiting that is not controlled by your nausea medication, call the clinic. If it is after clinic hours your family physician or the after hours number for the clinic or go to the Emergency Department.   BELOW ARE SYMPTOMS THAT SHOULD BE REPORTED IMMEDIATELY:  *FEVER GREATER THAN 100.5 F  *CHILLS WITH OR WITHOUT FEVER  NAUSEA AND VOMITING THAT IS NOT CONTROLLED WITH YOUR NAUSEA MEDICATION  *UNUSUAL SHORTNESS OF BREATH  *UNUSUAL BRUISING OR BLEEDING  TENDERNESS IN MOUTH AND THROAT WITH OR WITHOUT PRESENCE OF ULCERS  *URINARY PROBLEMS  *BOWEL PROBLEMS  UNUSUAL RASH Items with * indicate a potential emergency and should be followed up as soon as possible. . Feel free to call the clinic you have any questions or concerns. The clinic phone number is [phone].   I have been informed and understand all the instructions given to me. I know to contact the clinic, my physician, or go to the Emergency Department if any problems should occur. I do not have any questions at this time, but understand that I may call the clinic during office hours   should I have any questions or need assistance in obtaining follow up care.    __________________________________________  _____________  __________ Signature of Patient or Authorized Representative            Date                   Time    __________________________________________ Nurse's Signature

## 2012-06-10 ENCOUNTER — Ambulatory Visit (HOSPITAL_BASED_OUTPATIENT_CLINIC_OR_DEPARTMENT_OTHER): Payer: Medicare Other | Admitting: Oncology

## 2012-06-10 ENCOUNTER — Ambulatory Visit (HOSPITAL_BASED_OUTPATIENT_CLINIC_OR_DEPARTMENT_OTHER): Payer: Medicare Other | Admitting: Lab

## 2012-06-10 ENCOUNTER — Telehealth: Payer: Self-pay | Admitting: Oncology

## 2012-06-10 ENCOUNTER — Telehealth: Payer: Self-pay | Admitting: *Deleted

## 2012-06-10 ENCOUNTER — Encounter: Payer: Self-pay | Admitting: Oncology

## 2012-06-10 VITALS — BP 171/78 | HR 96 | Temp 97.3°F | Resp 18 | Ht 62.0 in | Wt 150.8 lb

## 2012-06-10 DIAGNOSIS — C7800 Secondary malignant neoplasm of unspecified lung: Secondary | ICD-10-CM

## 2012-06-10 DIAGNOSIS — C549 Malignant neoplasm of corpus uteri, unspecified: Secondary | ICD-10-CM

## 2012-06-10 DIAGNOSIS — C541 Malignant neoplasm of endometrium: Secondary | ICD-10-CM

## 2012-06-10 DIAGNOSIS — C7982 Secondary malignant neoplasm of genital organs: Secondary | ICD-10-CM

## 2012-06-10 DIAGNOSIS — T451X5A Adverse effect of antineoplastic and immunosuppressive drugs, initial encounter: Secondary | ICD-10-CM

## 2012-06-10 DIAGNOSIS — R32 Unspecified urinary incontinence: Secondary | ICD-10-CM

## 2012-06-10 LAB — CBC WITH DIFFERENTIAL/PLATELET
Basophils Absolute: 0 10*3/uL (ref 0.0–0.1)
EOS%: 4.4 % (ref 0.0–7.0)
HCT: 33.7 % — ABNORMAL LOW (ref 34.8–46.6)
HGB: 11.5 g/dL — ABNORMAL LOW (ref 11.6–15.9)
LYMPH%: 14.4 % (ref 14.0–49.7)
MCH: 32.9 pg (ref 25.1–34.0)
MCV: 96.3 fL (ref 79.5–101.0)
MONO%: 9.3 % (ref 0.0–14.0)
NEUT%: 71.4 % (ref 38.4–76.8)
Platelets: 200 10*3/uL (ref 145–400)
RDW: 15.9 % — ABNORMAL HIGH (ref 11.2–14.5)

## 2012-06-10 LAB — COMPREHENSIVE METABOLIC PANEL (CC13)
AST: 16 U/L (ref 5–34)
Alkaline Phosphatase: 58 U/L (ref 40–150)
BUN: 12.4 mg/dL (ref 7.0–26.0)
Creatinine: 0.8 mg/dL (ref 0.6–1.1)
Total Bilirubin: 0.31 mg/dL (ref 0.20–1.20)

## 2012-06-10 NOTE — Patient Instructions (Signed)
Please be sure to let Dr Clifton James know that we have set up PET to be done after #6 doxil. I expect decision re how many chemo treatments will depend on her exam and the PET information

## 2012-06-10 NOTE — Telephone Encounter (Signed)
Per staff phone call and POF I have schedueld appts.  JMW  

## 2012-06-10 NOTE — Progress Notes (Signed)
OFFICE PROGRESS NOTE   06/10/2012   Physicians:  J.Osborne, E.Skinner, J.Kinard, K.Supple, M.Johnson, P.Gehrig, Merrie Roof [facility] thoracic surgery), S.MacDiarmid   INTERVAL HISTORY:   Patient is seen, alone for visit, in continuing attention to her metastatic endometrial cancer involving vagina, also post resection of solitary pulmonary met.   Patient presented with postmenopausal vaginal bleeding, with stage 1 grade B2 endometrioid adenocarcinoma at surgery by Dr [name] 02-18-2010, which was robotic hysterectomy/BSO/pelvic and right periaortic lymphadnectomy with bladder suspension. She had adjuvant brachytherapy by Dr.Kinard 3000cGy in 5 fractions. She did well until she was found to have a metastatic nodule in the anterior vaginal wall in fall 2012, with no evidence of other involvement. She was treated with external beam radiation therapy 02-20-11 thru 03-27-11 then intracavitary brachytherapy x4 thru 05-02-11 by Dr Roselind Messier. She had initial good shrinkage of the tumor nodule after radiation, then in April 2013 was found to have new ulcerations in distal vagina which were biopsy proven to be progressive disease. Repeat CT AP had no evidence of other metastatic disease; she was treated with carboplatin and weekly taxol from 08-29-11 thru 11-14-11, initially with improvement in vaginal bleeding, then progression on treatment. She had scans by Dr Clifton James without evidence of distant metastatic disease and was referred to [facility] gyn oncology for anterior exenteration. Repeat CT/PET at [facility] on 12-15-11 reportedly showed a 1 cm FDG avid RUL pulmonary nodule and non-FDG avid 1.4 cm paratracheal and 1.6 cm periaortic nodes. She went to right VATS with upper lobe wedge resection at [facility] by Dr Merrie Roof on 01-03-12. Pathology from [facility] [phone]) had multifocal metastatic adenocarcinoma consistent with endometrial primary with clear margins and negative level 7 and level 4R lymph nodes. Treatment was begun with  single agent doxil on 01-30-13, with total 5 cycles doxil to date (10-16, 11-12, 03-26-12,04-23-12 and 05-28-12).  avastin was added on q 2 week schedule beginning 02-27-12 (11-12, 11-26, 12-10, 04-09-12 and 04-23-12). Patient had one episode of significant vaginal bleeding in Nov prior to starting avastin, treated at [facility], with Monsel's solution used on the vaginal erosive lesion without packing required then. The vaginal involvement has progressively improved with doxil/avastin, patient seen by Dr Clifton James ~ late Jan; I have not received her note from the last visit, tho patient reports that nothing was worse. She had at least 3 episodes of heavy vaginal bleeding in late Jan, requiring 1 unit PRBCs on 05-07-12 due to symptomatic anemia with hgb 9.7 from 12 on 04-26-12. She had LLQ pain initially thought related to diverticular disease, but actually was from left rectus abdominus hematoma. CT CAP done 05-07-12 did not show any progression in chest, diverticular changes and (on review) evidence of left rectus hematoma. We have continued single agent doxil since the recent bleeding complications, cycle 5 given on 05-28-12. Patient is to see Dr Clifton James on 06-20-12. Depending on gyn exam and likely repeat CT or PET CT after cycle 6 doxil, we will decide about additional treatments. Note she has not had repeat echocardiogram since that study was done prior to doxil.  Patient again was very fatigued for about 4 days beginning day 3 of most recent treatment. She is mostly in recliner during that time, and feels more SOB when she is fatigued. She is more energetic now, and denies SOB. Urinary incontinence is worse, happening when she stands or walks any distance or when she lifts >=3 lbs. She feels aching pain in vaginal area when she stands or walks. She has  had no further bleeding. She has had no fever or symptoms of infection. She has had no nausea tho appetite is rather poor, and no significant skin irritation from  the doxil. The rectus hematoma is gradually shrinking, but is still uncomfortable when she stands. No other pain including chest pain. Remainder of 10 point Review of Systems negative/ unchanged.  Objective:  Vital signs in last 24 hours:  BP 171/78  Pulse 96  Temp(Src) 97.3 F (36.3 C) (Oral)  Resp 18  Ht 5\' 2"  (1.575 m)  Wt 150 lb 12.8 oz (68.402 kg)  BMI 27.57 kg/m2 Weight is stable. Alert, easily ambulatory, respirations not labored RA   HEENT:PERRLA, sclera clear, anicteric, oropharynx clear, no lesions and neck supple with midline trachea Hair somewhat thin but no complete alopecia LymphaticsCervical, supraclavicular, and axillary nodes normal. Resp: clear to auscultation bilaterally and normal percussion bilaterally. Scars from right VATS well healed and nontender Cardio: regular rate and rhythm no gallop GI: abdomen soft, not distended, not tender other than ridge in lower left rectus abdominis which is not hot or fluctuant. Some bowel sounds. No HSM. Extremities: extremities normal, atraumatic, no cyanosis or edema Neuro:no sensory deficits noted Skin somewhat dry but no erythema or desquamation, palms ok. Portacath-without erythema or tenderness  Lab Results:  Results for orders placed in visit on 06/10/12  CBC WITH DIFFERENTIAL      Result Value Range   WBC 5.1  3.9 - 10.3 10e3/uL   NEUT# 3.6  1.5 - 6.5 10e3/uL   HGB 11.5 (*) 11.6 - 15.9 g/dL   HCT 78.2 (*) 95.6 - 21.3 %   Platelets 200  145 - 400 10e3/uL   MCV 96.3  79.5 - 101.0 fL   MCH 32.9  25.1 - 34.0 pg   MCHC 34.2  31.5 - 36.0 g/dL   RBC 0.86 (*) 5.78 - 4.69 10e6/uL   RDW 15.9 (*) 11.2 - 14.5 %   lymph# 0.7 (*) 0.9 - 3.3 10e3/uL   MONO# 0.5  0.1 - 0.9 10e3/uL   Eosinophils Absolute 0.2  0.0 - 0.5 10e3/uL   Basophils Absolute 0.0  0.0 - 0.1 10e3/uL   NEUT% 71.4  38.4 - 76.8 %   LYMPH% 14.4  14.0 - 49.7 %   MONO% 9.3  0.0 - 14.0 %   EOS% 4.4  0.0 - 7.0 %   BASO% 0.5  0.0 - 2.0 %  COMPREHENSIVE  METABOLIC PANEL (CC13)      Result Value Range   Sodium 141  136 - 145 mEq/L   Potassium 4.1  3.5 - 5.1 mEq/L   Chloride 107  98 - 107 mEq/L   CO2 26  22 - 29 mEq/L   Glucose 108 (*) 70 - 99 mg/dl   BUN 62.9  7.0 - 52.8 mg/dL   Creatinine 0.8  0.6 - 1.1 mg/dL   Total Bilirubin 4.13  0.20 - 1.20 mg/dL   Alkaline Phosphatase 58  40 - 150 U/L   AST 16  5 - 34 U/L   ALT 10  0 - 55 U/L   Total Protein 7.0  6.4 - 8.3 g/dL   Albumin 3.3 (*) 3.5 - 5.0 g/dL   Calcium 9.3  8.4 - 24.4 mg/dL     Studies/Results:  No results found.  Medications: I have reviewed the patient's current medications.  Assessment/Plan:  1. 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 She will have follow up visit with Dr Clifton James in early March, then cycle 6 doxil if appropriate on March 11. We will likely repeat CT +/- PET after cycle 6. Consider repeat echocardiogram if further doxil beyond 6 cycles.  2.vaginal bleeding and bleeding into rectus abdominus muscle without known trauma: complication of avastin, improved.  3.diverticulosis by CT  4.PAC in  5.anemia related to bleeding and chemotherapy: post transfusion PRBCs 06-07-12. Begin oral iron.  6.Hx HTN  Patient is comfortable with plan above  Arrington Bencomo P, MD   06/10/2012, 4:44 PM

## 2012-06-11 ENCOUNTER — Other Ambulatory Visit: Payer: Medicare Other | Admitting: Lab

## 2012-06-11 ENCOUNTER — Ambulatory Visit: Payer: Medicare Other | Admitting: Oncology

## 2012-06-25 ENCOUNTER — Ambulatory Visit (HOSPITAL_BASED_OUTPATIENT_CLINIC_OR_DEPARTMENT_OTHER): Payer: Medicare Other

## 2012-06-25 ENCOUNTER — Other Ambulatory Visit (HOSPITAL_BASED_OUTPATIENT_CLINIC_OR_DEPARTMENT_OTHER): Payer: Medicare Other | Admitting: Lab

## 2012-06-25 VITALS — BP 143/87 | HR 73 | Temp 97.9°F | Resp 18 | Ht 62.0 in | Wt 150.0 lb

## 2012-06-25 DIAGNOSIS — C541 Malignant neoplasm of endometrium: Secondary | ICD-10-CM

## 2012-06-25 DIAGNOSIS — C549 Malignant neoplasm of corpus uteri, unspecified: Secondary | ICD-10-CM

## 2012-06-25 DIAGNOSIS — Z5111 Encounter for antineoplastic chemotherapy: Secondary | ICD-10-CM

## 2012-06-25 DIAGNOSIS — C7982 Secondary malignant neoplasm of genital organs: Secondary | ICD-10-CM

## 2012-06-25 LAB — COMPREHENSIVE METABOLIC PANEL (CC13)
ALT: 10 U/L (ref 0–55)
AST: 15 U/L (ref 5–34)
Albumin: 3.1 g/dL — ABNORMAL LOW (ref 3.5–5.0)
Calcium: 9.2 mg/dL (ref 8.4–10.4)
Chloride: 106 mEq/L (ref 98–107)
Creatinine: 0.7 mg/dL (ref 0.6–1.1)
Potassium: 4.2 mEq/L (ref 3.5–5.1)
Sodium: 138 mEq/L (ref 136–145)
Total Protein: 6.7 g/dL (ref 6.4–8.3)

## 2012-06-25 LAB — CBC WITH DIFFERENTIAL/PLATELET
BASO%: 0.3 % (ref 0.0–2.0)
Eosinophils Absolute: 0.1 10*3/uL (ref 0.0–0.5)
MCHC: 33 g/dL (ref 31.5–36.0)
MONO#: 0.8 10*3/uL (ref 0.1–0.9)
NEUT#: 2.2 10*3/uL (ref 1.5–6.5)
RBC: 3.52 10*6/uL — ABNORMAL LOW (ref 3.70–5.45)
WBC: 3.8 10*3/uL — ABNORMAL LOW (ref 3.9–10.3)
lymph#: 0.7 10*3/uL — ABNORMAL LOW (ref 0.9–3.3)
nRBC: 0 % (ref 0–0)

## 2012-06-25 MED ORDER — DEXAMETHASONE SODIUM PHOSPHATE 10 MG/ML IJ SOLN
10.0000 mg | Freq: Once | INTRAMUSCULAR | Status: AC
  Administered 2012-06-25: 10 mg via INTRAVENOUS

## 2012-06-25 MED ORDER — HEPARIN SOD (PORK) LOCK FLUSH 100 UNIT/ML IV SOLN
500.0000 [IU] | Freq: Once | INTRAVENOUS | Status: AC | PRN
  Administered 2012-06-25: 500 [IU]
  Filled 2012-06-25: qty 5

## 2012-06-25 MED ORDER — SODIUM CHLORIDE 0.9 % IJ SOLN
10.0000 mL | INTRAMUSCULAR | Status: DC | PRN
  Administered 2012-06-25: 10 mL
  Filled 2012-06-25: qty 10

## 2012-06-25 MED ORDER — ONDANSETRON 8 MG/50ML IVPB (CHCC)
8.0000 mg | Freq: Once | INTRAVENOUS | Status: AC
  Administered 2012-06-25: 8 mg via INTRAVENOUS

## 2012-06-25 MED ORDER — DOXORUBICIN HCL LIPOSOMAL CHEMO INJECTION 2 MG/ML: 39.0000 mg/m2 | Freq: Once | INTRAVENOUS | Status: DC

## 2012-06-25 MED ORDER — DOXORUBICIN HCL LIPOSOMAL CHEMO INJECTION 2 MG/ML
40.0000 mg/m2 | Freq: Once | INTRAVENOUS | Status: AC
  Administered 2012-06-25: 70 mg via INTRAVENOUS
  Filled 2012-06-25: qty 35

## 2012-06-25 MED ORDER — SODIUM CHLORIDE 0.9 % IV SOLN
Freq: Once | INTRAVENOUS | Status: AC
  Administered 2012-06-25: 10:00:00 via INTRAVENOUS

## 2012-06-25 NOTE — Patient Instructions (Signed)
[facility] Discharge Instructions for Patients Receiving Chemotherapy  Today you received the following chemotherapy agents :  Doxil.  To help prevent nausea and vomiting after your treatment, we encourage you to take your nausea medication as instructed by your physician.    If you develop nausea and vomiting that is not controlled by your nausea medication, call the clinic. If it is after clinic hours your family physician or the after hours number for the clinic or go to the Emergency Department.   BELOW ARE SYMPTOMS THAT SHOULD BE REPORTED IMMEDIATELY:  *FEVER GREATER THAN 100.5 F  *CHILLS WITH OR WITHOUT FEVER  NAUSEA AND VOMITING THAT IS NOT CONTROLLED WITH YOUR NAUSEA MEDICATION  *UNUSUAL SHORTNESS OF BREATH  *UNUSUAL BRUISING OR BLEEDING  TENDERNESS IN MOUTH AND THROAT WITH OR WITHOUT PRESENCE OF ULCERS  *URINARY PROBLEMS  *BOWEL PROBLEMS  UNUSUAL RASH Items with * indicate a potential emergency and should be followed up as soon as possible.  One of the nurses will contact you 24 hours after your treatment. Please let the nurse know about any problems that you may have experienced. Feel free to call the clinic you have any questions or concerns. The clinic phone number is [phone].   I have been informed and understand all the instructions given to me. I know to contact the clinic, my physician, or go to the Emergency Department if any problems should occur. I do not have any questions at this time, but understand that I may call the clinic during office hours   should I have any questions or need assistance in obtaining follow up care.    __________________________________________  _____________  __________ Signature of Patient or Authorized Representative            Date                   Time    __________________________________________ Nurse's Signature

## 2012-07-15 ENCOUNTER — Encounter: Payer: Self-pay | Admitting: Oncology

## 2012-07-15 NOTE — Progress Notes (Signed)
[employer] approved pet scan for 07/15/12 auth # [id number] 07/15/12-08/29/12.

## 2012-07-16 ENCOUNTER — Encounter (HOSPITAL_COMMUNITY)
Admission: RE | Admit: 2012-07-16 | Discharge: 2012-07-16 | Disposition: A | Discharge status: Home / Self Care | Payer: Medicare Other | Source: Ambulatory Visit | Attending: Oncology | Admitting: Oncology

## 2012-07-16 DIAGNOSIS — C549 Malignant neoplasm of corpus uteri, unspecified: Secondary | ICD-10-CM | POA: Insufficient documentation

## 2012-07-16 DIAGNOSIS — C541 Malignant neoplasm of endometrium: Secondary | ICD-10-CM

## 2012-07-16 MED ORDER — FLUDEOXYGLUCOSE F - 18 (FDG) INJECTION
16.0000 | Freq: Once | INTRAVENOUS | Status: AC | PRN
  Administered 2012-07-16: 16 via INTRAVENOUS

## 2012-07-21 ENCOUNTER — Other Ambulatory Visit: Payer: Self-pay | Admitting: Oncology

## 2012-07-22 ENCOUNTER — Ambulatory Visit (HOSPITAL_BASED_OUTPATIENT_CLINIC_OR_DEPARTMENT_OTHER): Payer: Medicare Other | Admitting: Oncology

## 2012-07-22 ENCOUNTER — Telehealth: Payer: Self-pay | Admitting: Oncology

## 2012-07-22 ENCOUNTER — Other Ambulatory Visit (HOSPITAL_BASED_OUTPATIENT_CLINIC_OR_DEPARTMENT_OTHER): Payer: Medicare Other | Admitting: Lab

## 2012-07-22 ENCOUNTER — Other Ambulatory Visit: Payer: Self-pay

## 2012-07-22 VITALS — BP 185/93 | HR 73 | Temp 97.5°F | Resp 18 | Ht 62.0 in | Wt 156.0 lb

## 2012-07-22 DIAGNOSIS — C541 Malignant neoplasm of endometrium: Secondary | ICD-10-CM

## 2012-07-22 DIAGNOSIS — C549 Malignant neoplasm of corpus uteri, unspecified: Secondary | ICD-10-CM

## 2012-07-22 DIAGNOSIS — R350 Frequency of micturition: Secondary | ICD-10-CM

## 2012-07-22 DIAGNOSIS — C7800 Secondary malignant neoplasm of unspecified lung: Secondary | ICD-10-CM

## 2012-07-22 DIAGNOSIS — T451X5A Adverse effect of antineoplastic and immunosuppressive drugs, initial encounter: Secondary | ICD-10-CM

## 2012-07-22 LAB — CBC WITH DIFFERENTIAL/PLATELET
Basophils Absolute: 0 10*3/uL (ref 0.0–0.1)
Eosinophils Absolute: 0.1 10*3/uL (ref 0.0–0.5)
HGB: 11.6 g/dL (ref 11.6–15.9)
LYMPH%: 11.3 % — ABNORMAL LOW (ref 14.0–49.7)
MCV: 97.9 fL (ref 79.5–101.0)
MONO%: 16.2 % — ABNORMAL HIGH (ref 0.0–14.0)
NEUT#: 4 10*3/uL (ref 1.5–6.5)
Platelets: 188 10*3/uL (ref 145–400)
RDW: 14.5 % (ref 11.2–14.5)

## 2012-07-22 NOTE — Progress Notes (Signed)
ENCOUNTER OPENED IN ERROR

## 2012-07-22 NOTE — Patient Instructions (Signed)
Start back on lisinopril for blood pressure. Follow blood pressure daily at home and take readings with you when you see Dr Earl Gala next month. Call his office if BP does not improve on the lisinopril.  Dr Darrold Span will see you when the portacath needs to be flushed in May, or sooner if Dr Clifton James needs something differently

## 2012-07-28 ENCOUNTER — Encounter: Payer: Self-pay | Admitting: Oncology

## 2012-07-28 NOTE — Progress Notes (Signed)
OFFICE PROGRESS NOTE   07/28/2012   Physicians:J.Osborne, E.Skinner, J.Kinard, K.Supple, M.Johnson, P.Gehrig, Immunologist [facility] thoracic surgery), S.MacDiarmid   INTERVAL HISTORY:   Patient is seen, alone for visit, in continuing attention to her metastatic endometrial cancer, which has been recurrent in vagina and for which she is post resection of solitary pulmonary met. She most recently completed 6 cycles of doxil from 01-31-2012 thru 06-25-2012, with avastin given x 3 cycles from 02-27-12  thru 04-23-12, that stopped due to rectus abdominus bleed. Patient is feeling progressively better off of chemotherapy now. She had PET 07-16-12, which does not identify disease outside of pelvis. She has PAC in, flushed with the PET 07-16-12.   Oncologic History Patient presented with postmenopausal vaginal bleeding, with stage 1 grade B2 endometrioid adenocarcinoma at surgery by Dr [name] 02-18-2010, which was robotic hysterectomy/BSO/pelvic and right periaortic lymphadnectomy with bladder suspension. She had adjuvant brachytherapy by Dr.Kinard 3000cGy in 5 fractions. She did well until she had a metastatic nodule in the anterior vaginal wall in fall 2012, with no evidence of other involvement. She was treated with external beam radiation therapy 02-20-11 thru 03-27-11 then intracavitary brachytherapy x4 thru 05-02-11 by Dr Roselind Messier. She had initial good shrinkage of the tumor nodule after radiation, then in April 2013 was found to have new ulcerations in distal vagina which were  progressive disease. Repeat CT AP had no evidence of other metastatic disease; she was treated with carboplatin and weekly taxol from 08-29-11 thru 11-14-11, initially with improvement in vaginal bleeding, then progression on treatment. She had scans by Dr Clifton James without evidence of distant metastatic disease and was referred to [facility] gyn oncology for anterior exenteration. Repeat CT/PET at [facility] on 12-15-11 reportedly showed a 1 cm FDG avid  RUL pulmonary nodule and non-FDG avid 1.4 cm paratracheal and 1.6 cm periaortic nodes. She went to right VATS with upper lobe wedge resection at [facility] by Dr Merrie Roof on 01-03-12. Pathology from [facility] [phone]) had multifocal metastatic adenocarcinoma consistent with endometrial primary with clear margins and negative level 7 and level 4R lymph nodes. Treatment was begun with single agent doxil on 01-30-13, with avastin added after financial clearance. Patient had one episode of significant vaginal bleeding prior to starting avastin, and at least 3 episodes of heavy vaginal bleeding in late Jan, requiring 1 unit PRBCs on 05-07-12. She had LLQ pain initially thought related to diverticular disease, but actually was from left inferior rectus abdominus hematoma in late Jan. by CT CAP done 05-07-12. The area of bleeding clinically resolved over several weeks. She tolerated last cycles of doxil without problems other than fatigue and mild skin toxicity.  Patient has seen a small amount of rectal blood in past couple of weeks, usually with BMs. She has had no other bleeding. She feels fatigued "winded" in AMs until about 10:00, then better for the rest of day. She denies nausea, fever, any abdominal or pelvic pain, LE swelling, chest pain, cough, palpitations. Blood pressure has been higher at home also and she will resume lisinopril; she will need to be in touch with Dr Earl Gala if BP does not improve back on her previous antihypertensive. She still has urinary incontinence when she lifts over ~ 5 lbs or when she stands for prolonged time, this better than previously but still bothersome. Remainder of 10 point Review of Systems negative.  Objective:  Vital signs in last 24 hours:  BP 185/93  Pulse 73  Temp(Src) 97.5 F (36.4 C) (Oral)  Resp  18  Ht 5\' 2"  (1.575 m)  Wt 156 lb (70.761 kg)  BMI 28.53 kg/m2  Easily ambulatory, looks comfortable, respirations not labored  HEENT:PERRLA, sclera clear, anicteric  and oropharynx clear, no lesions LymphaticsCervical, supraclavicular, and axillary nodes normal. Resp: clear to auscultation bilaterally and normal percussion bilaterally Cardio: regular rate and rhythm GI: soft, non-tender; bowel sounds normal; no masses,  no organomegaly Extremities: extremities normal, atraumatic, no cyanosis or edema Neuro:no sensory deficits noted Breast:normal without suspicious masses, skin or nipple changes or axillary nodes and self-exam is taught and encouraged Portacath-without erythema or tenderness, last flushed with PET 07-16-12  Lab Results:  Results for orders placed in visit on 07/22/12  CBC WITH DIFFERENTIAL      Result Value Range   WBC 5.7  3.9 - 10.3 10e3/uL   NEUT# 4.0  1.5 - 6.5 10e3/uL   HGB 11.6  11.6 - 15.9 g/dL   HCT 40.9 (*) 81.1 - 91.4 %   Platelets 188  145 - 400 10e3/uL   MCV 97.9  79.5 - 101.0 fL   MCH 33.0  25.1 - 34.0 pg   MCHC 33.7  31.5 - 36.0 g/dL   RBC 7.82 (*) 9.56 - 2.13 10e6/uL   RDW 14.5  11.2 - 14.5 %   lymph# 0.6 (*) 0.9 - 3.3 10e3/uL   MONO# 0.9  0.1 - 0.9 10e3/uL   Eosinophils Absolute 0.1  0.0 - 0.5 10e3/uL   Basophils Absolute 0.0  0.0 - 0.1 10e3/uL   NEUT% 69.8  38.4 - 76.8 %   LYMPH% 11.3 (*) 14.0 - 49.7 %   MONO% 16.2 (*) 0.0 - 14.0 %   EOS% 2.2  0.0 - 7.0 %   BASO% 0.5  0.0 - 2.0 %    Last CMET 06-25-12 Studies/Results:  NUCLEAR MEDICINE PET SKULL BASE TO THIGH  07-16-2012  Comparison: 02/06/2011  Findings:  Neck: No hypermetabolic lymph nodes in the neck.  Chest: No hypermetabolic mediastinal or hilar nodes. No  suspicious pulmonary nodules on the CT scan. Stable 5 mm nodule in  the posterior left lower lobe, image 89/series 2.  Abdomen/Pelvis: No abnormal hypermetabolic activity within the  liver, pancreas, adrenal glands, or spleen. No hypermetabolic  lymph nodes in the abdomen or pelvis.  Within the left lower quadrant of the abdomen in the region of the  ventral abdominal wall there is a focal area  of soft tissue  thickening which measures 3.1 x 4.7 cm. The SUV max associated  this area is equal to 4.7, image 192.  Similar to recent CT there is abnormal thickening involving the  vaginal wall. The SUV max associated this area is equal to 5.8.  Focal, solitary area of intense uptake localizing to the posterior  wall of the lower rectum is noted. This has an SUV max equal to  10.8, image 212.  Skeleton: No focal hypermetabolic activity to suggest skeletal  metastasis.  IMPRESSION:  1. There are no specific features to suggest distant metastatic  disease within the chest or upper abdomen.  2. Malignant range FDG uptake is associated with a focal area of  abnormal soft tissue thickening within the left lower quadrant  ventral pelvic wall. This is nonspecific and may represent  inflammation associated postsurgical change, hematoma, or recurrent  tumor.  3. Nonspecific FDG uptake in the region of the vaginal wall is  noted corresponding to the soft tissue thickening noted on recent  CT. Correlation with physical exam findings is advised to  assess  for recurrent metastatic disease to the vagina.  4. Focal area of intense uptake within the posterior wall of the  lower rectum. Consider correlation with direct visualization or  physical exam findings.    Medications: I have reviewed the patient's current medications. Resume lisinopril  Following patient's visit, I discussed course and PET scan findings directly with Dr Clifton James, who wonders if there might be a tumor nodule in the rectus muscle which caused the bleeding there. Patient is to see her upcoming. We discussed chemotherapy break if appropriate.  Assessment/Plan: 1. 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 PET scan shows no activity outside of pelvis. She will see Dr Clifton James next, then I will see her at lease when she is due next [facility] flush in May, or sooner  if needed. Has seen some BRB per rectum, will do sitz baths in case possibly hemorrhoids. 2. Bleed into rectus abdominus without known trauma: thought complication of avastin, improved. Dr Clifton James questions possible tumor nodule in that area leading to bleed. 3.diverticulosis by CT  4.PAC in  5.anemia related to bleeding and chemotherapy: post transfusion PRBCs 06-07-12. Continuing oral iron, hemoglobin improving. 6.More elevated blood pressure: resume previous lisinopril and follow readings at home prior to scheduled appointment back to primary physician next month. 7.Urinary incontinence: being treated by Dr Sherron Monday.      Jaystin Mcgarvey P, MD   07/28/2012, 10:33 AM

## 2012-08-02 ENCOUNTER — Telehealth: Payer: Self-pay | Admitting: Oncology

## 2012-08-02 NOTE — Telephone Encounter (Signed)
s.w. pt and advised on appt changed to 5.21.15.Marland KitchenMarland Kitchenpt ok.Marland KitchenMarland KitchenMarland Kitchen

## 2012-09-02 ENCOUNTER — Ambulatory Visit: Payer: Medicare Other | Admitting: Oncology

## 2012-09-02 ENCOUNTER — Other Ambulatory Visit: Payer: Medicare Other | Admitting: Lab

## 2012-09-04 ENCOUNTER — Other Ambulatory Visit (HOSPITAL_BASED_OUTPATIENT_CLINIC_OR_DEPARTMENT_OTHER): Payer: Medicare Other | Admitting: Lab

## 2012-09-04 ENCOUNTER — Telehealth: Payer: Self-pay | Admitting: Oncology

## 2012-09-04 ENCOUNTER — Encounter: Payer: Self-pay | Admitting: Oncology

## 2012-09-04 ENCOUNTER — Ambulatory Visit: Payer: Medicare Other

## 2012-09-04 ENCOUNTER — Ambulatory Visit (HOSPITAL_BASED_OUTPATIENT_CLINIC_OR_DEPARTMENT_OTHER): Payer: Medicare Other | Admitting: Oncology

## 2012-09-04 VITALS — BP 148/82 | HR 73 | Temp 97.8°F | Resp 17 | Ht 62.0 in | Wt 159.1 lb

## 2012-09-04 DIAGNOSIS — C541 Malignant neoplasm of endometrium: Secondary | ICD-10-CM

## 2012-09-04 DIAGNOSIS — C7800 Secondary malignant neoplasm of unspecified lung: Secondary | ICD-10-CM

## 2012-09-04 DIAGNOSIS — D649 Anemia, unspecified: Secondary | ICD-10-CM

## 2012-09-04 DIAGNOSIS — C549 Malignant neoplasm of corpus uteri, unspecified: Secondary | ICD-10-CM

## 2012-09-04 DIAGNOSIS — C7982 Secondary malignant neoplasm of genital organs: Secondary | ICD-10-CM

## 2012-09-04 LAB — COMPREHENSIVE METABOLIC PANEL (CC13)
BUN: 18.4 mg/dL (ref 7.0–26.0)
CO2: 20 mEq/L — ABNORMAL LOW (ref 22–29)
Calcium: 9.2 mg/dL (ref 8.4–10.4)
Chloride: 112 mEq/L — ABNORMAL HIGH (ref 98–107)
Creatinine: 0.9 mg/dL (ref 0.6–1.1)
Total Bilirubin: 0.32 mg/dL (ref 0.20–1.20)

## 2012-09-04 LAB — CBC WITH DIFFERENTIAL/PLATELET
Basophils Absolute: 0 10*3/uL (ref 0.0–0.1)
EOS%: 3.2 % (ref 0.0–7.0)
HCT: 33.7 % — ABNORMAL LOW (ref 34.8–46.6)
HGB: 11.7 g/dL (ref 11.6–15.9)
LYMPH%: 16.5 % (ref 14.0–49.7)
MCH: 33.6 pg (ref 25.1–34.0)
MONO#: 0.7 10*3/uL (ref 0.1–0.9)
NEUT%: 67.9 % (ref 38.4–76.8)
Platelets: 216 10*3/uL (ref 145–400)
lymph#: 0.9 10*3/uL (ref 0.9–3.3)

## 2012-09-04 MED ORDER — HEPARIN SOD (PORK) LOCK FLUSH 100 UNIT/ML IV SOLN
500.0000 [IU] | Freq: Once | INTRAVENOUS | Status: AC
  Administered 2012-09-04: 500 [IU] via INTRAVENOUS
  Filled 2012-09-04: qty 5

## 2012-09-04 MED ORDER — SODIUM CHLORIDE 0.9 % IJ SOLN
10.0000 mL | INTRAMUSCULAR | Status: DC | PRN
  Administered 2012-09-04: 10 mL via INTRAVENOUS
  Filled 2012-09-04: qty 10

## 2012-09-04 NOTE — Telephone Encounter (Signed)
Gave pt appt for July lab and flush, pt does not want to to see Md on july , ok per Dr. Darrold Span

## 2012-09-04 NOTE — Patient Instructions (Signed)
Call MD for problems 

## 2012-09-04 NOTE — Patient Instructions (Signed)
Megace (megesterol) will increase your appetite while you are taking that medicine, but appetite will go back to normal when you switch to the tamoxifen (nolvadex).  If you get swelling or pain in legs, need to let MD know, as there is some increased risk of blood clots from Megace; staying active will help with this.  Your portacath needs to be kept flushed every 6-8 weeks when not being used, next flush due between July 2 and July 16

## 2012-09-04 NOTE — Progress Notes (Signed)
OFFICE PROGRESS NOTE   09/04/2012   Physicians:.J.Osborne, E.Skinner, J.Kinard, K.Supple, M.Johnson, P.Gehrig, Merrie Roof [facility] thoracic surgery), S.MacDiarmid   INTERVAL HISTORY:   Patient is seen with PAC flush today, previously treated at this office with chemotherapy for metastatic endometrial cancer involving vagina and post resection of solitary pulmonary met. She saw Dr Clifton James last ~ 3 weeks ago and has started Megace which will be alternated with tamoxifen. She is feeling well overall now, with appetite also much better on Megace.  Oncologic History  Patient presented with postmenopausal vaginal bleeding, with stage 1 grade B2 endometrioid adenocarcinoma at surgery by Dr [name] 02-18-2010, which was robotic hysterectomy/BSO/pelvic and right periaortic lymphadnectomy with bladder suspension. She had adjuvant brachytherapy by Dr.Kinard 3000cGy in 5 fractions. She did well until she had a metastatic nodule in the anterior vaginal wall in fall 2012, with no evidence of other involvement. She was treated with external beam radiation therapy 02-20-11 thru 03-27-11 then intracavitary brachytherapy x4 thru 05-02-11 by Dr Roselind Messier. She had initial good shrinkage of the tumor nodule after radiation, then in April 2013 was found to have new ulcerations in distal vagina which were progressive disease. Repeat CT AP had no evidence of other metastatic disease; she was treated with carboplatin and weekly taxol from 08-29-11 thru 11-14-11, initially with improvement in vaginal bleeding, then progression on treatment. She had scans by Dr Clifton James without evidence of distant metastatic disease and was referred to [facility] gyn oncology for anterior exenteration. Repeat CT/PET at [facility] on 12-15-11 showed a 1 cm FDG avid RUL pulmonary nodule and non-FDG avid 1.4 cm paratracheal and 1.6 cm periaortic nodes. She went to right VATS with upper lobe wedge resection at [facility] by Dr Merrie Roof on 01-03-12. Pathology from [facility]  [phone]) had multifocal metastatic adenocarcinoma consistent with endometrial primary with clear margins and negative level 7 and level 4R lymph nodes. Treatment was begun with single agent doxil on 01-30-13, with avastin added after financial clearance. Patient had one episode of significant vaginal bleeding prior to starting avastin, and at least 3 episodes of heavy vaginal bleeding in late Jan, requiring 1 unit PRBCs on 05-07-12. She had LLQ pain initially thought related to diverticular disease, but actually was from left inferior rectus abdominus hematoma in late Jan. by CT CAP done 05-07-12. The area of bleeding clinically resolved over several weeks.She most recently completed 6 cycles of doxil from 01-31-2012 thru 06-25-2012, with avastin given x 3 cycles from 02-27-12 thru 04-23-12, that stopped due to rectus abdominus bleed. Patient is feeling progressively better off of chemotherapy now. She had PET 07-16-12, which did not identify disease outside of pelvis. She has had maximal pelvic RT. She began Megace 80 mg bid almost 3 weeks ago, with plan to take this x 21 days then alternate with tamoxifen.   Review of Systems No bleeding. No abdominal or pelvic pain. Chronic arthritic problems hips and knees are most limiting and she would like to resume water exercises for these. No SOB, cough, chest pain. No recent infectious illness. No swelling LE. Bowels ok, bladder unchanged. Remainder of 10 point Review of Systems negative.  Blood counts are fine for gum grafting by periodontist upcoming.  Objective:  Vital signs in last 24 hours:  BP 148/82  Pulse 73  Temp(Src) 97.8 F (36.6 C) (Oral)  Resp 17  Ht 5\' 2"  (1.575 m)  Wt 159 lb 1.6 oz (72.167 kg)  BMI 29.09 kg/m2 Weight is up 3 lbs. Alert, looks comfortable, ambulatory  slowly without assistance.   HEENT:PERRLA, sclera clear, anicteric and oropharynx clear, no lesions No alopecia LymphaticsCervical, supraclavicular, and axillary nodes  normal. Resp: clear to auscultation bilaterally and normal percussion bilaterally. Scar right posterior chest from VATS healed and not tender. Cardio: regular rate and rhythm GI: soft, non-tender; bowel sounds normal; no masses,  no organomegaly Extremities: no pitting edema, cords, tenderness Neuro:no sensory deficits noted Skin without rash or ecchymosis, doxil changes resolved. Portacath -without erythema or tenderness, flushed with good blood return today.  Lab Results:  Results for orders placed in visit on 09/04/12  CBC WITH DIFFERENTIAL      Result Value Range   WBC 5.6  3.9 - 10.3 10e3/uL   NEUT# 3.8  1.5 - 6.5 10e3/uL   HGB 11.7  11.6 - 15.9 g/dL   HCT 16.1 (*) 09.6 - 04.5 %   Platelets 216  145 - 400 10e3/uL   MCV 97.0  79.5 - 101.0 fL   MCH 33.6  25.1 - 34.0 pg   MCHC 34.6  31.5 - 36.0 g/dL   RBC 4.09 (*) 8.11 - 9.14 10e6/uL   RDW 14.2  11.2 - 14.5 %   lymph# 0.9  0.9 - 3.3 10e3/uL   MONO# 0.7  0.1 - 0.9 10e3/uL   Eosinophils Absolute 0.2  0.0 - 0.5 10e3/uL   Basophils Absolute 0.0  0.0 - 0.1 10e3/uL   NEUT% 67.9  38.4 - 76.8 %   LYMPH% 16.5  14.0 - 49.7 %   MONO% 11.9  0.0 - 14.0 %   EOS% 3.2  0.0 - 7.0 %   BASO% 0.5  0.0 - 2.0 %  COMPREHENSIVE METABOLIC PANEL (CC13)      Result Value Range   Sodium 140  136 - 145 mEq/L   Potassium 4.5  3.5 - 5.1 mEq/L   Chloride 112 Repeated and Verified (*) 98 - 107 mEq/L   CO2 20 (*) 22 - 29 mEq/L   Glucose 92  70 - 99 mg/dl   BUN 78.2  7.0 - 95.6 mg/dL   Creatinine 0.9  0.6 - 1.1 mg/dL   Total Bilirubin 2.13  0.20 - 1.20 mg/dL   Alkaline Phosphatase 51  40 - 150 U/L   AST 17  5 - 34 U/L   ALT 12  0 - 55 U/L   Total Protein 6.9  6.4 - 8.3 g/dL   Albumin 3.4 (*) 3.5 - 5.0 g/dL   Calcium 9.2  8.4 - 08.6 mg/dL     Studies/Results:  No results found.  Medications: I have reviewed the patient's current medications. I have reviewed Megace side effects including increased appetite and some increased risk of  DVT.  Assessment/Plan:  1.Endometrial adenocarcinoma metastatic to vagina and post resection of solitary pulmonary met: history as above with good performance status now. Has started alternating Megace and Tamoxifen by Dr Clifton James, whom she will see again in late July or early August.  2.PAC needs to be flushed every 6-8 weeks when not otherwise used 3.multifactorial anemia improving 4.bleed into rectus abdominus muscle thought complication of avastin, resolved 5.diverticulosis by CT 6.HTN, on lisinopril 7.degenerative arthritis 8.urinary incontinence known to urology  We can either set up provider visit with PAC flush in 2 months or 4 months, or can see her back when requested by Dr Clifton James.  Jaion Lagrange P, MD   09/04/2012, 3:47 PM

## 2012-09-07 ENCOUNTER — Encounter: Payer: Self-pay | Admitting: Oncology

## 2012-09-07 NOTE — Progress Notes (Signed)
[facility] END OF TREATMENT   Name: [name] Date: 09/07/2012 MRN: [id number] DOB: 1927/05/29   TREATMENT DATES: 01-31-2012 thru 06-25-2012   REFERRING PHYSICIAN: [name], MD  DIAGNOSIS: endometrial adenocarcinoma   STAGE AT START OF TREATMENT: IV involving vagina and post resection of solitary pulmonary met   INTENT: control/ palliation   DRUGS OR REGIMENS GIVEN: doxil x 6 cycles from 01-31-12 thru 06-25-12 given with avastin x 3 cycles from 02-26-13 thru 04-23-12.   MAJOR TOXICITIES: rectus abdominus bleed   REASON TREATMENT STOPPED: avastin stopped due to rectus abdominus bleed; doxil stopped due to completion of planned course.   PERFORMANCE STATUS AT END: 1   ONGOING PROBLEMS: mild fatigue   FOLLOW UP PLANS: megace/ tamoxifen

## 2012-09-23 ENCOUNTER — Telehealth: Payer: Self-pay

## 2012-09-23 NOTE — Telephone Encounter (Signed)
[name] is scheduled for a Porta cath flush on 10-16-12.  She received the letter that Dr. Darrold Span was leaving [facility].  She was requesting a visit with Dr. Darrold Span that day to see her one more time.  Rescheduled flush appointment that day to accommodate visit with Dr. Darrold Span as patient requested.

## 2012-09-25 ENCOUNTER — Telehealth: Payer: Self-pay

## 2012-09-25 NOTE — Telephone Encounter (Signed)
Told Ms. Shuster that the medical clearance form is signed and ready to be picked up from the injection nurse Liborio Nixon. A copy of the signed form was sent to medical records to be sacnned into the patient's EMR.

## 2012-10-15 ENCOUNTER — Other Ambulatory Visit: Payer: Self-pay

## 2012-10-15 DIAGNOSIS — Z1231 Encounter for screening mammogram for malignant neoplasm of breast: Secondary | ICD-10-CM

## 2012-10-16 ENCOUNTER — Telehealth: Payer: Self-pay | Admitting: *Deleted

## 2012-10-16 ENCOUNTER — Telehealth: Payer: Self-pay | Admitting: Oncology

## 2012-10-16 ENCOUNTER — Ambulatory Visit (HOSPITAL_BASED_OUTPATIENT_CLINIC_OR_DEPARTMENT_OTHER): Payer: Medicare Other | Admitting: Oncology

## 2012-10-16 ENCOUNTER — Ambulatory Visit: Payer: Medicare Other

## 2012-10-16 ENCOUNTER — Ambulatory Visit: Payer: Medicare Other | Admitting: Oncology

## 2012-10-16 ENCOUNTER — Encounter: Payer: Self-pay | Admitting: Oncology

## 2012-10-16 ENCOUNTER — Other Ambulatory Visit (HOSPITAL_BASED_OUTPATIENT_CLINIC_OR_DEPARTMENT_OTHER): Payer: Medicare Other | Admitting: Lab

## 2012-10-16 ENCOUNTER — Other Ambulatory Visit: Payer: Medicare Other | Admitting: Lab

## 2012-10-16 ENCOUNTER — Ambulatory Visit (HOSPITAL_COMMUNITY)
Admission: RE | Admit: 2012-10-16 | Discharge: 2012-10-16 | Disposition: A | Discharge status: Home / Self Care | Payer: Medicare Other | Source: Ambulatory Visit | Attending: Oncology | Admitting: Oncology

## 2012-10-16 VITALS — BP 171/89 | HR 82 | Temp 97.8°F

## 2012-10-16 VITALS — BP 152/84 | HR 77 | Temp 98.0°F | Resp 20 | Ht 62.0 in | Wt 161.6 lb

## 2012-10-16 DIAGNOSIS — C549 Malignant neoplasm of corpus uteri, unspecified: Secondary | ICD-10-CM | POA: Insufficient documentation

## 2012-10-16 DIAGNOSIS — C541 Malignant neoplasm of endometrium: Secondary | ICD-10-CM

## 2012-10-16 DIAGNOSIS — R0602 Shortness of breath: Secondary | ICD-10-CM | POA: Insufficient documentation

## 2012-10-16 LAB — CBC WITH DIFFERENTIAL/PLATELET
Basophils Absolute: 0 10*3/uL (ref 0.0–0.1)
Eosinophils Absolute: 0.1 10*3/uL (ref 0.0–0.5)
HGB: 11.2 g/dL — ABNORMAL LOW (ref 11.6–15.9)
LYMPH%: 16.2 % (ref 14.0–49.7)
MCV: 97.1 fL (ref 79.5–101.0)
MONO%: 9.5 % (ref 0.0–14.0)
NEUT#: 4.9 10*3/uL (ref 1.5–6.5)
Platelets: 201 10*3/uL (ref 145–400)
RDW: 13.3 % (ref 11.2–14.5)

## 2012-10-16 LAB — COMPREHENSIVE METABOLIC PANEL (CC13)
Albumin: 3.4 g/dL — ABNORMAL LOW (ref 3.5–5.0)
Alkaline Phosphatase: 37 U/L — ABNORMAL LOW (ref 40–150)
BUN: 18.1 mg/dL (ref 7.0–26.0)
CO2: 21 mEq/L — ABNORMAL LOW (ref 22–29)
Glucose: 98 mg/dl (ref 70–140)
Potassium: 4.3 mEq/L (ref 3.5–5.1)

## 2012-10-16 MED ORDER — HEPARIN SOD (PORK) LOCK FLUSH 100 UNIT/ML IV SOLN
500.0000 [IU] | Freq: Once | INTRAVENOUS | Status: AC
  Administered 2012-10-16: 500 [IU] via INTRAVENOUS
  Filled 2012-10-16: qty 5

## 2012-10-16 MED ORDER — SODIUM CHLORIDE 0.9 % IJ SOLN
10.0000 mL | INTRAMUSCULAR | Status: DC | PRN
  Administered 2012-10-16: 10 mL via INTRAVENOUS
  Filled 2012-10-16: qty 10

## 2012-10-16 NOTE — Telephone Encounter (Signed)
appts made and printed.pt is aware to go have a cxr today. She is also aware that when the orders for the CT is entered cs will call her w/ appt d/t...td

## 2012-10-16 NOTE — Patient Instructions (Signed)
Implanted Port Instructions  An implanted port is a central line that has a round shape and is placed under the skin. It is used for long-term IV (intravenous) access for:  · Medicine.  · Fluids.  · Liquid nutrition, such as TPN (total parenteral nutrition).  · Blood samples.  Ports can be placed:  · In the chest area just below the collarbone (this is the most common place.)  · In the arms.  · In the belly (abdomen) area.  · In the legs.  PARTS OF THE PORT  A port has 2 main parts:  · The reservoir. The reservoir is round, disc-shaped, and will be a small, raised area under your skin.  · The reservoir is the part where a needle is inserted (accessed) to either give medicines or to draw blood.  · The catheter. The catheter is a long, slender tube that extends from the reservoir. The catheter is placed into a large vein.  · Medicine that is inserted into the reservoir goes into the catheter and then into the vein.  INSERTION OF THE PORT  · The port is surgically placed in either an operating room or in a procedural area (interventional radiology).  · Medicine may be given to help you relax during the procedure.  · The skin where the port will be inserted is numbed (local anesthetic).  · 1 or 2 small cuts (incisions) will be made in the skin to insert the port.  · The port can be used after it has been inserted.  INCISION SITE CARE  · The incision site may have small adhesive strips on it. This helps keep the incision site closed. Sometimes, no adhesive strips are placed. Instead of adhesive strips, a special kind of surgical glue is used to keep the incision closed.  · If adhesive strips were placed on the incision sites, do not take them off. They will fall off on their own.  · The incision site may be sore for 1 to 2 days. Pain medicine can help.  · Do not get the incision site wet. Bathe or shower as directed by your caregiver.  · The incision site should heal in 5 to 7 days. A small scar may form after the  incision has healed.  ACCESSING THE PORT  Special steps must be taken to access the port:  · Before the port is accessed, a numbing cream can be placed on the skin. This helps numb the skin over the port site.  · A sterile technique is used to access the port.  · The port is accessed with a needle. Only "non-coring" port needles should be used to access the port. Once the port is accessed, a blood return should be checked. This helps ensure the port is in the vein and is not clogged (clotted).  · If your caregiver believes your port should remain accessed, a clear (transparent) bandage will be placed over the needle site. The bandage and needle will need to be changed every week or as directed by your caregiver.  · Keep the bandage covering the needle clean and dry. Do not get it wet. Follow your caregiver's instructions on how to take a shower or bath when the port is accessed.  · If your port does not need to stay accessed, no bandage is needed over the port.  FLUSHING THE PORT  Flushing the port keeps it from getting clogged. How often the port is flushed depends on:  · If a   constant infusion is running. If a constant infusion is running, the port may not need to be flushed.  · If intermittent medicines are given.  · If the port is not being used.  For intermittent medicines:  · The port will need to be flushed:  · After medicines have been given.  · After blood has been drawn.  · As part of routine maintenance.  · A port is normally flushed with:  · Normal saline.  · Heparin.  · Follow your caregiver's advice on how often, how much, and the type of flush to use on your port.  IMPORTANT PORT INFORMATION  · Tell your caregiver if you are allergic to heparin.  · After your port is placed, you will get a manufacturer's information card. The card has information about your port. Keep this card with you at all times.  · There are many types of ports available. Know what kind of port you have.  · In case of an  emergency, it may be helpful to wear a medical alert bracelet. This can help alert health care workers that you have a port.  · The port can stay in for as long as your caregiver believes it is necessary.  · When it is time for the port to come out, surgery will be done to remove it. The surgery will be similar to how the port was put in.  · If you are in the hospital or clinic:  · Your port will be taken care of and flushed by a nurse.  · If you are at home:  · A home health care nurse may give medicines and take care of the port.  · You or a family member can get special training and directions for giving medicine and taking care of the port at home.  SEEK IMMEDIATE MEDICAL CARE IF:   · Your port does not flush or you are unable to get a blood return.  · New drainage or pus is coming from the incision.  · A bad smell is coming from the incision site.  · You develop swelling or increased redness at the incision site.  · You develop increased swelling or pain at the port site.  · You develop swelling or pain in the surrounding skin near the port.  · You have an oral temperature above 102° F (38.9° C), not controlled by medicine.  MAKE SURE YOU:   · Understand these instructions.  · Will watch your condition.  · Will get help right away if you are not doing well or get worse.  Document Released: 04/03/2005 Document Revised: 06/26/2011 Document Reviewed: 06/25/2008  [employer]® Patient Information ©2014 [employer].

## 2012-10-16 NOTE — Telephone Encounter (Signed)
Medical Oncology  Spoke with patient to let her know that CXR today is stable, nothing acute or different that we can tell on that. She knows to call if problems and we will recheck CTs before appointment with Dr Clifton James in AugustIla Mcgill, MD

## 2012-10-16 NOTE — Progress Notes (Signed)
OFFICE PROGRESS NOTE   10/16/2012   Physicians:J.Osborne, E.Skinner, J.Kinard, K.Supple, M.Johnson, P.Gehrig, Merrie Roof [facility] thoracic surgery), S.MacDiarmid   INTERVAL HISTORY:  Patient is seen, alone for visit, in continuing attention to metastatic endometrial cancer which involves vagina and for which she is post resection of solitary pulmonary metastasis Sept 2013. She continues alternating tamoxifen and megace for 21 day cycles each, this per Dr Clifton James whom she is to see next on 11-25-2012.  Last CT CAP was in Jan 2014; last PET  April 2014 LLQ ventral pelvic wall, vagina and posterior wall of lower rectum.  She has PAC in, flushed today.    Oncologic History  Patient presented with postmenopausal vaginal bleeding, with stage 1 grade B2 endometrioid adenocarcinoma at surgery by Dr [name] 02-18-2010, which was robotic hysterectomy/BSO/pelvic and right periaortic lymphadnectomy with bladder suspension. She had adjuvant brachytherapy by Dr.Kinard 3000cGy in 5 fractions. She did well until she had a metastatic nodule in the anterior vaginal wall in fall 2012, with no evidence of other involvement. She was treated with external beam radiation therapy 02-20-11 thru 03-27-11 then intracavitary brachytherapy x4 thru 05-02-11 by Dr Roselind Messier. She had initial good shrinkage of the tumor nodule after radiation, then in April 2013 was found to have new ulcerations in distal vagina which were progressive disease. Repeat CT AP had no evidence of other metastatic disease; she was treated with carboplatin and weekly taxol from 08-29-11 thru 11-14-11, initially with improvement in vaginal bleeding, then progression on treatment. She had scans by Dr Clifton James without evidence of distant metastatic disease and was referred to [facility] gyn oncology for anterior exenteration. Repeat CT/PET at [facility] on 12-15-11 showed a 1 cm FDG avid RUL pulmonary nodule and non-FDG avid 1.4 cm paratracheal and 1.6 cm periaortic nodes. She  went to right VATS with upper lobe wedge resection at [facility] by Dr Merrie Roof on 01-03-12. Pathology from [facility] [phone]) had multifocal metastatic adenocarcinoma consistent with endometrial primary with clear margins and negative level 7 and level 4R lymph nodes. Treatment was begun with single agent doxil on 01-30-13, with avastin added after financial clearance. Patient had one episode of significant vaginal bleeding prior to starting avastin, and at least 3 episodes of heavy vaginal bleeding in late Jan, requiring 1 unit PRBCs on 05-07-12. She had LLQ pain initially thought related to diverticular disease, but actually was from left inferior rectus abdominus hematoma in late Jan. by CT CAP done 05-07-12. The area of bleeding clinically resolved over several weeks.She most recently completed 6 cycles of doxil from 01-31-2012 thru 06-25-2012, with avastin given x 3 cycles from 02-27-12 thru 04-23-12, that stopped due to rectus abdominus bleed. Patient is feeling progressively better off of chemotherapy now. She had PET 07-16-12, which did not identify disease outside of pelvis. She has had maximal pelvic RT. She began Megace 80 mg bid in early May, this x 21 days alternating with 21 day cycles of tamoxifen.   Slight vaginal spotting intermittently and ongoing urinary incontinence which requires her to use pads continuously (known to Dr Sherron Monday of urology). Fatigues with exertion such as walking office hall, tho she has been trying to exercise a little more. Appetite uncontrollable with the megace, returns to normal during the tamoxifen weeks; has gained 5-6 lbs since starting Megace by our records. No pain including chest pain. No cough or wheezing. No other bleeding. No abdominal discomfort, not aware of any mass now in LLQ. Bowels moving regularly. Sleeps well at night. No fever  or symptoms of infection. No problems with PAC. Remainder of 10 point Review of Systems negative.  Objective:  Vital signs in last 24  hours:  BP 152/84  Pulse 77  Temp(Src) 98 F (36.7 C) (Oral)  Resp 20  Ht 5\' 2"  (1.575 m)  Wt 161 lb 9.6 oz (73.301 kg)  BMI 29.55 kg/m2  Weight had been 156 in April prior to starting Megace. Slightly dyspneic getting on and off exam table  HEENT:PERRLA, sclera clear, anicteric and oropharynx clear, no lesions. Normal hair pattern LymphaticsCervical, supraclavicular, and axillary nodes normal. No inguinal adenopathy Resp: clear to auscultation bilaterally and normal percussion bilaterally. Well healed incision right lower chest from the VATS resection. Cardio: regular rate and rhythm GI: soft, nontender, not distended, normal bowel sounds, no appreciable HSM or mass, nothing palpable LLQ abdominal wall Extremities: extremities normal, atraumatic, no cyanosis or edema, no cords or tenderness Neuro:nonfocal. Gait is somewhat unsteady due to degenerative arthritis Breasts bilaterally without mass or other findings of concern Portacath -without erythema or tenderness  Lab Results:  Results for orders placed in visit on 10/16/12  CBC WITH DIFFERENTIAL      Result Value Range   WBC 6.8  3.9 - 10.3 10e3/uL   NEUT# 4.9  1.5 - 6.5 10e3/uL   HGB 11.2 (*) 11.6 - 15.9 g/dL   HCT 16.1 (*) 09.6 - 04.5 %   Platelets 201  145 - 400 10e3/uL   MCV 97.1  79.5 - 101.0 fL   MCH 33.7  25.1 - 34.0 pg   MCHC 34.7  31.5 - 36.0 g/dL   RBC 4.09 (*) 8.11 - 9.14 10e6/uL   RDW 13.3  11.2 - 14.5 %   lymph# 1.1  0.9 - 3.3 10e3/uL   MONO# 0.6  0.1 - 0.9 10e3/uL   Eosinophils Absolute 0.1  0.0 - 0.5 10e3/uL   Basophils Absolute 0.0  0.0 - 0.1 10e3/uL   NEUT% 72.0  38.4 - 76.8 %   LYMPH% 16.2  14.0 - 49.7 %   MONO% 9.5  0.0 - 14.0 %   EOS% 1.9  0.0 - 7.0 %   BASO% 0.4  0.0 - 2.0 %  COMPREHENSIVE METABOLIC PANEL (CC13)      Result Value Range   Sodium 142  136 - 145 mEq/L   Potassium 4.3  3.5 - 5.1 mEq/L   Chloride 115 (*) 98 - 109 mEq/L   CO2 21 (*) 22 - 29 mEq/L   Glucose 98  70 - 140 mg/dl    BUN 78.2  7.0 - 95.6 mg/dL   Creatinine 1.0  0.6 - 1.1 mg/dL   Total Bilirubin 2.13  0.20 - 1.20 mg/dL   Alkaline Phosphatase 37 (*) 40 - 150 U/L   AST 17  5 - 34 U/L   ALT 13  0 - 55 U/L   Total Protein 6.7  6.4 - 8.3 g/dL   Albumin 3.4 (*) 3.5 - 5.0 g/dL   Calcium 8.7  8.4 - 08.6 mg/dL    Hgb is down from 57.8.  Will check iron studies with next labs from [facility] in August  Studies/Results:  CHEST - 2 VIEW done after visit 10-16-12 Comparison: 05/07/2012  Findings: The power port is stable. The cardiac silhouette,  mediastinal and hilar contours are unchanged. The lungs are clear  of acute findings. Stable right upper lobe surgical scarring  changes. No pleural effusion and/or pneumothorax. The bony  thorax is intact.  IMPRESSION:  No acute  cardiopulmonary findings and no findings for pulmonary  metastatic lesions.    Medications: I have reviewed the patient's current medications. She continues oral iron.  She has scheduled exam with PCP Dr Earl Gala upcoming.  Assessment/Plan:  1.Endometrial adenocarcinoma metastatic to vagina and post resection of solitary pulmonary met: history as above with good performance status now. Has started alternating Megace and Tamoxifen by Dr Clifton James, whom she will see  August 11. I have ordered CT CAP to be done in [place] shortly prior to Dr Gibson Ramp visit. With some increase in SOB at today's visit, CXR was done after visit without obvious new problems; she would need further evaluation sooner than scheduled scans if this worsens. 2.PAC needs to be flushed every 6-8 weeks when not otherwise used  3.multifactorial anemia: ongoing vaginal bleeding tho this seems light. She continues oral iron, follow. 4.bleed into rectus abdominus muscle thought complication of avastin, resolved  5.diverticulosis by CT  6.HTN, on lisinopril  7.degenerative arthritis  8.urinary incontinence known to urology       Reece Packer, MD   10/16/2012, 3:00  PM

## 2012-10-17 ENCOUNTER — Telehealth: Payer: Self-pay | Admitting: Oncology

## 2012-10-17 ENCOUNTER — Other Ambulatory Visit: Payer: Self-pay | Admitting: Oncology

## 2012-10-17 DIAGNOSIS — C541 Malignant neoplasm of endometrium: Secondary | ICD-10-CM

## 2012-10-17 NOTE — Telephone Encounter (Signed)
m °

## 2012-11-04 ENCOUNTER — Telehealth: Payer: Self-pay | Admitting: *Deleted

## 2012-11-04 NOTE — Telephone Encounter (Signed)
Pt called to cancel her flush and she plans on coming in on 12/11/12 to get one then...td

## 2012-11-15 ENCOUNTER — Ambulatory Visit
Admission: RE | Admit: 2012-11-15 | Discharge: 2012-11-15 | Disposition: A | Discharge status: Home / Self Care | Payer: Medicare Other | Source: Ambulatory Visit

## 2012-11-15 DIAGNOSIS — Z1231 Encounter for screening mammogram for malignant neoplasm of breast: Secondary | ICD-10-CM

## 2012-11-18 ENCOUNTER — Ambulatory Visit (HOSPITAL_COMMUNITY)
Admission: RE | Admit: 2012-11-18 | Discharge: 2012-11-18 | Disposition: A | Discharge status: Home / Self Care | Payer: Medicare Other | Source: Ambulatory Visit | Attending: Oncology | Admitting: Oncology

## 2012-11-18 ENCOUNTER — Encounter (HOSPITAL_COMMUNITY): Payer: Self-pay

## 2012-11-18 DIAGNOSIS — C541 Malignant neoplasm of endometrium: Secondary | ICD-10-CM

## 2012-11-18 DIAGNOSIS — K449 Diaphragmatic hernia without obstruction or gangrene: Secondary | ICD-10-CM | POA: Insufficient documentation

## 2012-11-18 DIAGNOSIS — R918 Other nonspecific abnormal finding of lung field: Secondary | ICD-10-CM | POA: Insufficient documentation

## 2012-11-18 DIAGNOSIS — C549 Malignant neoplasm of corpus uteri, unspecified: Secondary | ICD-10-CM | POA: Insufficient documentation

## 2012-11-18 DIAGNOSIS — N281 Cyst of kidney, acquired: Secondary | ICD-10-CM | POA: Insufficient documentation

## 2012-11-18 DIAGNOSIS — K7689 Other specified diseases of liver: Secondary | ICD-10-CM | POA: Insufficient documentation

## 2012-11-18 DIAGNOSIS — I2510 Atherosclerotic heart disease of native coronary artery without angina pectoris: Secondary | ICD-10-CM | POA: Insufficient documentation

## 2012-11-18 DIAGNOSIS — I700 Atherosclerosis of aorta: Secondary | ICD-10-CM | POA: Insufficient documentation

## 2012-11-18 DIAGNOSIS — K5730 Diverticulosis of large intestine without perforation or abscess without bleeding: Secondary | ICD-10-CM | POA: Insufficient documentation

## 2012-11-18 MED ORDER — IOHEXOL 300 MG/ML  SOLN
100.0000 mL | Freq: Once | INTRAMUSCULAR | Status: AC | PRN
  Administered 2012-11-18: 100 mL via INTRAVENOUS

## 2012-11-26 ENCOUNTER — Telehealth: Payer: Self-pay

## 2012-11-26 NOTE — Telephone Encounter (Signed)
Pt. has appointment with Dr. Clifton James in [facility] on 12-02-12.  Faxed report to Dr. Gibson Ramp office for visit. Pac was not flushed with CT scan.  Pt. To get PAC flush 12-11-12 with visit with covering provider.

## 2012-12-03 ENCOUNTER — Telehealth: Payer: Self-pay | Admitting: Oncology

## 2012-12-05 ENCOUNTER — Telehealth: Payer: Self-pay | Admitting: Oncology

## 2012-12-05 NOTE — Telephone Encounter (Signed)
sw. pt and advised on appt move to earlier time pt ok and awre

## 2012-12-10 ENCOUNTER — Encounter: Payer: Self-pay | Admitting: Gynecologic Oncology

## 2012-12-10 ENCOUNTER — Ambulatory Visit: Payer: Medicare Other | Attending: Gynecologic Oncology | Admitting: Gynecologic Oncology

## 2012-12-10 VITALS — BP 141/71 | HR 77 | Temp 97.9°F | Resp 16 | Ht 61.97 in | Wt 151.5 lb

## 2012-12-10 DIAGNOSIS — M899 Disorder of bone, unspecified: Secondary | ICD-10-CM | POA: Insufficient documentation

## 2012-12-10 DIAGNOSIS — K7689 Other specified diseases of liver: Secondary | ICD-10-CM | POA: Insufficient documentation

## 2012-12-10 DIAGNOSIS — E785 Hyperlipidemia, unspecified: Secondary | ICD-10-CM | POA: Insufficient documentation

## 2012-12-10 DIAGNOSIS — Z923 Personal history of irradiation: Secondary | ICD-10-CM | POA: Insufficient documentation

## 2012-12-10 DIAGNOSIS — Z9079 Acquired absence of other genital organ(s): Secondary | ICD-10-CM | POA: Insufficient documentation

## 2012-12-10 DIAGNOSIS — C541 Malignant neoplasm of endometrium: Secondary | ICD-10-CM

## 2012-12-10 DIAGNOSIS — I10 Essential (primary) hypertension: Secondary | ICD-10-CM | POA: Insufficient documentation

## 2012-12-10 DIAGNOSIS — R5381 Other malaise: Secondary | ICD-10-CM | POA: Insufficient documentation

## 2012-12-10 DIAGNOSIS — Z90710 Acquired absence of both cervix and uterus: Secondary | ICD-10-CM | POA: Insufficient documentation

## 2012-12-10 DIAGNOSIS — C549 Malignant neoplasm of corpus uteri, unspecified: Secondary | ICD-10-CM | POA: Insufficient documentation

## 2012-12-10 DIAGNOSIS — Z79899 Other long term (current) drug therapy: Secondary | ICD-10-CM | POA: Insufficient documentation

## 2012-12-10 NOTE — Patient Instructions (Signed)
PET scan for August 28 at 2:00pm.  Arrive at [facility] at 1:45 pm.  Nothing to eat or drink six hours before.  Can only have water.  You may eat before 8am.

## 2012-12-10 NOTE — Progress Notes (Signed)
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 She received adjuvant vaginal brachytherapy.  Recurrence was noted in 2012 and disease within the vagina persisted after treatment with external beam radiotherapy and vaginal brachytherapy and Taxol carboplatin.  Patient presents today for assessment and consideration for repeat elective exenteration. At this visit her major complaints are that of the lower currently pelvic pain associated with nausea and vaginal discharge. Her findings are also notable for recent weight loss.  PET scan 4 months ago awoke confirmed the presence of disease localized to the pelvis. CT scan of the abdomen and pelvis just of rectal involvement.  On physical examination bilateral parametrial disease is appreciated. The disease is noted in both the anterior and posterior vagina and extends anteriorly to the urethra.  The options presented the patient were that of a total pelvic exenteration with ileal conduit and possible colostomy.  The patient and her family were informed that if this is done for pain alleviation and there is evidence of metastatic disease elsewhere then recurrent disease will have to be managed in some other fashion. The risks and benefits of this procedure were shared as well as long hospital stay repeated hospitalizations. The patient at this time is more concerned about quality of life and has some concern about a prolonged hospitalization or recovery. This tumor has been resistant to chemotherapy and so I offered the possibility of tumor biopsy with specimen evaluation by [facility] 1 testing to evaluate if there molecular changes that can be targeted for therapy. The third option presented to her was that of palliative care. She informs me that she has begun to see a palliative care physician.  A PET scan has been ordered and  in 4 months since the last study. If this did show evidence of metastatic disease outside the pelvis [name] would be less inclined to pursue an exenterative procedure.  She will followup in one week to discuss her decision.   History of Present Illness:  [name]  Is an 79 y.o. who was diagnosed with endometrial cancer in 2011. She underwent a robotic hysterectomy, bilateral salpingo oophorectomy, pelvic and right periaortic lymphadenectomy. Her final pathology was consistent with a Stage IB Grade 2 endometrioid adenocarcinoma of the uterus. She underwent 3000 of vaginal brachytherapy. In September 2012 she was noticed to have a lesion to the left of the urethra. Surgical resection versus radiation was offered and the patient  completed radiation therapy from 03/02/11 to 05/02/11 with pelvic radiation and brachytherapy to the lower vagina. The patient had persistent disease and was given three cycles of chemotherapy (weekly taxol and carboplatin).  The lesion persisted thru chemotherapy. Given the persistence thru radiation and chemotherapy, an assessment was made for possible exenteration.   PET 12/15/2011   Intense FDG uptake within the anterior vagina consistent with diagnosis of recurrent endometrial cancer. FDG avid pulmonary nodule in the right upper lobe, worrisome for malignancy; metatatic disease vs lung primary.   She underwent VATS wedge resection of the RUL 1 cm nodule on 01/03/2012  Path c/w metastatic multifocal endometrial cancer.  0/14 LN negative.  She then received doxil 01/2012 until 07/2012.  Avastin was added to the Doxil regimen however was discontinued after the third cycle when the patient developed a significant spontaneous rectus muscle bleed.    PET 07/2012 IMPRESSION:  1. There are no specific features to suggest  distant metastatic disease within the chest or upper abdomen.  2. Malignant range FDG uptake is associated with a focal area of abnormal soft tissue  thickening within the left lower quadrant ventral pelvic wall. This is nonspecific and may represent inflammation associated postsurgical change, hematoma, or recurrent tumor.  3. Nonspecific FDG uptake in the region of the vaginal wall is noted corresponding to the soft tissue thickening noted on recent CT. Correlation with physical exam findings is advised to assess for recurrent metastatic disease to the vagina.  4. Focal area of intense uptake within the posterior wall of the lower rectum. Consider correlation with direct visualization or physical exam findings.   Since April she has been on alternating regimen of tamoxifen and Megace.  Her complaints are at the foul-smelling vaginal discharge and bleeding. She reports intermittent crampy lower abdominal pain associated with nausea. The pain does not radiate and is approximately 5/10 when it occurs. She reports severe fatigue for which she is unable to participate in activities which she enjoys, she reports poor appetite and a 12 pound weight loss. reports occasional headache and dizziness. She denies flank pain she denies lower treatment edema but does report right-sided vulvar edema  Medical/Surgical History:  PMH: Hypertension, osteopenia, parathyriod adenoma, hemorrhoids, kidney stones, hyperlipidemia, liver cysts, cystocele, histroy of strep infection in leg  PSH: robotic hysterectomy, bilateral salpingo oophorectomy and staging, sling anterior posterior repair at time of hysterectomy and staging, kidney stone removal, breast reduction, parathryriodectomy, leg debridement, Dilation and curretage x2   VATS wedge resection of the RUL 01/03/2012    Health maintenance:  mammogram 2011, colonoscopy 2007   Social History:  Married, denies tobacco or alcohol use  Family history of ovarian, uterine, colon or breast malignancy: None  Family history of coagulopathy: None    Review of Systems  Constitutional  Reports excessive fatigue, 12  pound weight loss,  [name] is too fatigued to perform grocery shopping however it does to her other activities of daily living. She wishes to be able to do her gardening but she is too fatigued to do this Cardiovascular  No chest pain, palpitations, difficulty breathing, high blood pressure reports severe fatigue Pulmonary  No shortness of breath, cough, wheezing, or hemoptysis.  Endocrine  Gastro Intestinal  Or a poor appetite, intermittent nausea no bleeding, reports  constipation, denies diarrhea, or BRBPR.  Genito Urinary  No pain with urination, persistent leakage of urine, but reports intermittent void strength a day.  Continuous daily vaginal bleeding or foul-smelling discharge  Musculo Skeletal  No swollen / painful joints, neck pain, severe leg cramps, or back pain / injury.  Neurologic  No frequent or severe headaches, dizziness, fainting, or recurrent numbness Psychology  No mood swing / irritability, or depression / anxiety.  Heme/Lymph  denies adenopathy   Physical Examination  Vitals.  BP 141/71  Pulse 77  Temp(Src) 97.9 F (36.6 C) (Oral)  Resp 16  Ht 5' 1.97" (1.574 m)  Wt 151 lb 8 oz (68.72 kg)  BMI 27.74 kg/m2  SpO2 96%  General  The patient is a pleasant Caucasian lady accompanied by her husband and daughter in no acute distress  Neck  Supple without any enlargements. No thyromegaly. Centrally located trachea.  Lymph Nodes  No cervical, axillary, or inguinal adenopathy.  Cardiovascular  Pulse normal rate, regularity and rhythm. S1 and S2 normal, without any murmur, rub, or gallop.  Pulses 2+ equal on both sides without any bruits.  Lungs  Clear to  auscultation bilateraly, without wheezes / crackles / rhonchi. Good air movement with a normal work of breathing.  Back No CVA tenderness Psychiatry  Alert and oriented to person, place, and time. Appropriate patient interaction.  Abdomen  Normoactive bowel sounds. Abdomen soft, non-tender and not  distended without hepatosplenomegaly or mass. Liver normal in size, guarding with lower pelvic palpation Genito Urinary  Normal external genitals. no visable lesions. On speculum insertion circumferential tumor involvement of the vagina from the cuff  the urethral opening anteriorly and posterior to the mid vagina is appreciated. Parametrial nodularity is noted bilaterally without evidence of extension to the pelvic sidewall. Prominent right labia minora edema is appreciated Rectal  Normal tone, no rectovaginal masses or nodularity  Extremeties  No bilateral cyanosis, clubbing or edema. No rash or lesions.  Musculo Skeletal  Neurological     Chemistry      Component Value Date/Time   NA 142 10/16/2012 1306   NA 136 02/17/2012 0210   K 4.3 10/16/2012 1306   K 3.5 02/17/2012 0210   CL 112 Repeated and Verified* 09/04/2012 1059   CL 102 02/17/2012 0210   CO2 21* 10/16/2012 1306   CO2 26 02/17/2012 0210   BUN 18.1 10/16/2012 1306   BUN 11 02/17/2012 0210   CREATININE 1.0 10/16/2012 1306   CREATININE 0.71 02/17/2012 0210      Component Value Date/Time   CALCIUM 8.7 10/16/2012 1306   CALCIUM 8.5 02/17/2012 0210   ALKPHOS 37* 10/16/2012 1306   ALKPHOS 58 02/17/2012 0210   AST 17 10/16/2012 1306   AST 17 02/17/2012 0210   ALT 13 10/16/2012 1306   ALT 12 02/17/2012 0210   BILITOT 0.29 10/16/2012 1306   BILITOT 0.2* 02/17/2012 0210

## 2012-12-11 ENCOUNTER — Ambulatory Visit (HOSPITAL_BASED_OUTPATIENT_CLINIC_OR_DEPARTMENT_OTHER): Payer: Medicare Other

## 2012-12-11 ENCOUNTER — Ambulatory Visit: Payer: Medicare Other | Attending: Oncology | Admitting: Oncology

## 2012-12-11 ENCOUNTER — Other Ambulatory Visit: Payer: Medicare Other | Admitting: Lab

## 2012-12-11 ENCOUNTER — Ambulatory Visit: Payer: Medicare Other

## 2012-12-11 ENCOUNTER — Ambulatory Visit: Payer: Medicare Other | Admitting: Oncology

## 2012-12-11 ENCOUNTER — Other Ambulatory Visit (HOSPITAL_BASED_OUTPATIENT_CLINIC_OR_DEPARTMENT_OTHER): Payer: Medicare Other | Admitting: Lab

## 2012-12-11 VITALS — BP 146/63 | HR 74 | Temp 98.6°F | Resp 20 | Ht 61.0 in | Wt 152.2 lb

## 2012-12-11 DIAGNOSIS — C7982 Secondary malignant neoplasm of genital organs: Secondary | ICD-10-CM

## 2012-12-11 DIAGNOSIS — C549 Malignant neoplasm of corpus uteri, unspecified: Secondary | ICD-10-CM

## 2012-12-11 DIAGNOSIS — R350 Frequency of micturition: Secondary | ICD-10-CM

## 2012-12-11 DIAGNOSIS — C541 Malignant neoplasm of endometrium: Secondary | ICD-10-CM

## 2012-12-11 DIAGNOSIS — D649 Anemia, unspecified: Secondary | ICD-10-CM

## 2012-12-11 DIAGNOSIS — N390 Urinary tract infection, site not specified: Secondary | ICD-10-CM

## 2012-12-11 LAB — COMPREHENSIVE METABOLIC PANEL (CC13)
CO2: 24 mEq/L (ref 22–29)
Calcium: 8.7 mg/dL (ref 8.4–10.4)
Creatinine: 0.9 mg/dL (ref 0.6–1.1)
Glucose: 83 mg/dl (ref 70–140)
Total Bilirubin: 0.24 mg/dL (ref 0.20–1.20)

## 2012-12-11 LAB — CBC WITH DIFFERENTIAL/PLATELET
Basophils Absolute: 0 10*3/uL (ref 0.0–0.1)
Eosinophils Absolute: 0.1 10*3/uL (ref 0.0–0.5)
HCT: 32.5 % — ABNORMAL LOW (ref 34.8–46.6)
HGB: 10.2 g/dL — ABNORMAL LOW (ref 11.6–15.9)
LYMPH%: 8.5 % — ABNORMAL LOW (ref 14.0–49.7)
MCHC: 31.4 g/dL — ABNORMAL LOW (ref 31.5–36.0)
MONO#: 0.8 10*3/uL (ref 0.1–0.9)
NEUT#: 7.2 10*3/uL — ABNORMAL HIGH (ref 1.5–6.5)
NEUT%: 80.7 % — ABNORMAL HIGH (ref 38.4–76.8)
Platelets: 236 10*3/uL (ref 145–400)
WBC: 9 10*3/uL (ref 3.9–10.3)

## 2012-12-11 LAB — URINALYSIS, MICROSCOPIC - CHCC
Bilirubin (Urine): NEGATIVE
Glucose: NEGATIVE mg/dL
Ketones: NEGATIVE mg/dL
Specific Gravity, Urine: 1.015 (ref 1.003–1.035)
pH: 6 (ref 4.6–8.0)

## 2012-12-11 LAB — IRON AND TIBC CHCC
%SAT: 26 % (ref 21–57)
Iron: 64 ug/dL (ref 41–142)
UIBC: 182 ug/dL (ref 120–384)

## 2012-12-11 LAB — FERRITIN CHCC: Ferritin: 183 ng/ml (ref 9–269)

## 2012-12-11 MED ORDER — HEPARIN SOD (PORK) LOCK FLUSH 100 UNIT/ML IV SOLN
500.0000 [IU] | Freq: Once | INTRAVENOUS | Status: AC
  Administered 2012-12-11: 500 [IU] via INTRAVENOUS
  Filled 2012-12-11: qty 5

## 2012-12-11 MED ORDER — SODIUM CHLORIDE 0.9 % IJ SOLN
10.0000 mL | INTRAMUSCULAR | Status: DC | PRN
  Administered 2012-12-11: 10 mL via INTRAVENOUS
  Filled 2012-12-11: qty 10

## 2012-12-11 NOTE — Progress Notes (Signed)
OFFICE PROGRESS NOTE   12/11/2012   Physicians:J.Osborne, E.Skinner, J.Kinard, K.Supple, M.Johnson, P.Gehrig, Merrie Roof [facility] thoracic surgery), S.MacDiarmid, Oris Drone (palliative care)   INTERVAL HISTORY:  Patient is seen, alone for visit, in continuing attention to metastatic endometrial cancer, which has progressed at least in vaginal/ urethral area on most recent treatment with tamoxifen and megace. She is post resection of solitary pulmonary metastasis in Sept 2013 and has had extensive radiation and previous chemotherapy. She has been followed by Dr [name] for gyn oncology, but is now also seeing Dr Laurette Schimke for consideration of pelvic exenteration with ileal conduit/ possible colostomy. PET is planned 12-12-12 and she will see Dr Nelly Rout again after that study. Most recent CT CAP was in [place] system 11-18-12.  Patient has ongoing vaginal bleeding, recently fairly light. She has urinary incontinence which is distressing for her, frequently up every hour at night with this and constantly worried about odor, and she is exhausted. She is being seen by palliative care physician Dr Oris Drone [phone]), with home evaluation and some medication adjustments that have been helpful. She needs pain medication at hs, with discomfort in low pelvis worse at night. She finished antibiotics for UTI last week, was to have follow up urine check without MD visit at urology next week but has requested that the urine be repeated today here and that result communicated to urology. She also is concerned about continuing Myrbetriq with progression of cancer causing urinary symptoms; I have told her to hold this and will let Dr Sherron Monday know. She has PAC in, flushed today    ONCOLOGIC HISTORY Patient presented with postmenopausal vaginal bleeding, with stage 1 grade B2 endometrioid adenocarcinoma at surgery by Dr [name] 02-18-2010, which was robotic  hysterectomy/BSO/pelvic and right periaortic lymphadnectomy with bladder suspension. She had adjuvant brachytherapy by Dr.Kinard 3000cGy in 5 fractions. She did well until she had a metastatic nodule in the anterior vaginal wall in fall 2012, with no evidence of other involvement. She was treated with external beam radiation therapy 02-20-11 thru 03-27-11 then intracavitary brachytherapy x4 thru 05-02-11 by Dr Roselind Messier. She had initial good shrinkage of the tumor nodule after radiation, then in April 2013 was found to have new ulcerations in distal vagina which were progressive disease. Repeat CT AP had no evidence of other metastatic disease; she was treated with carboplatin and weekly taxol from 08-29-11 thru 11-14-11, initially with improvement in vaginal bleeding, then progression on treatment. She had scans by Dr Clifton James without evidence of distant metastatic disease and was referred to [facility] gyn oncology for anterior exenteration. Repeat CT/PET at [facility] on 12-15-11 showed a 1 cm FDG avid RUL pulmonary nodule and non-FDG avid 1.4 cm paratracheal and 1.6 cm periaortic nodes. She went to right VATS with upper lobe wedge resection at [facility] by Dr Merrie Roof on 01-03-12. Pathology from [facility] [phone]) had multifocal metastatic adenocarcinoma consistent with endometrial primary with clear margins and negative level 7 and level 4R lymph nodes. Treatment was begun with single agent doxil on 01-30-13, with avastin added after financial clearance. Patient had one episode of significant vaginal bleeding prior to starting avastin, and at least 3 episodes of heavy vaginal bleeding in late Jan, requiring 1 unit PRBCs on 05-07-12. She had LLQ pain initially thought related to diverticular disease, but actually was from left inferior rectus abdominus hematoma in late Jan. by CT CAP done 05-07-12. The area of bleeding clinically resolved over several weeks.She most recently completed 6 cycles of doxil  from 01-31-2012 thru 06-25-2012, with  avastin given x 3 cycles from 02-27-12 thru 04-23-12, that stopped due to rectus abdominus bleed. Patient is feeling progressively better off of chemotherapy now. She had PET 07-16-12, which did not identify disease outside of pelvis. She has had maximal pelvic RT. She began Megace 80 mg bid in early May, this x 21 days alternating with 21 day cycles of tamoxifen; the Megace and tamoxifen was [facility] with progression on CT early August.   Review of systems as above, also: Fatigued, sleeps at most 4 hours per night. No fever. No other bleeding. In bed most of last week due to nausea and dizziness. Pain is low pelvis. No increased SOB or other respiratory symptoms.Appetite poor.  Remainder of 10 point Review of Systems negative.  Objective:  Vital signs in last 24 hours:  BP 146/63  Pulse 74  Temp(Src) 98.6 F (37 C) (Oral)  Resp 20  Ht 5\' 1"  (1.549 m)  Wt 152 lb 3.2 oz (69.037 kg)  BMI 28.77 kg/m2 Weight is down 9.5 lbs from early July. Alert, oriented and appropriate. Ambulatory with some difficulty, chronic hip problems. Appears to have lost weight. Respirations not labored RA. Slightly pale. Neatly dressed as always.   HEENT:PERRL, sclerae not icteric. Oral mucosa moist without lesions, posterior pharynx clear. Neck supple. Lymphatics no cervical,suraclavicular, axillary or inguinal adenopathy Resp: clear to auscultation bilaterally and normal percussion bilaterally. Well healed incision right posterior chest from VATS. Cardio: regular rate and rhythm. No gallop. GI: soft, nontender, not distended, no mass or organomegaly. Surgical incision not remarkable. Extremities: without pitting edema, cords, tenderness Neuro: no peripheral neuropathy.  Skin without rash, ecchymosis, petechiae. Portacath-without erythema or tenderness  Lab Results:  Results for orders placed in visit on 12/11/12  CBC WITH DIFFERENTIAL      Result Value Range   WBC 9.0  3.9 - 10.3 10e3/uL   NEUT# 7.2 (*) 1.5 -  6.5 10e3/uL   HGB 10.2 (*) 11.6 - 15.9 g/dL   HCT 16.1 (*) 09.6 - 04.5 %   Platelets 236  145 - 400 10e3/uL   MCV 100.0  79.5 - 101.0 fL   MCH 31.4  25.1 - 34.0 pg   MCHC 31.4 (*) 31.5 - 36.0 g/dL   RBC 4.09 (*) 8.11 - 9.14 10e6/uL   RDW 12.5  11.2 - 14.5 %   lymph# 0.8 (*) 0.9 - 3.3 10e3/uL   MONO# 0.8  0.1 - 0.9 10e3/uL   Eosinophils Absolute 0.1  0.0 - 0.5 10e3/uL   Basophils Absolute 0.0  0.0 - 0.1 10e3/uL   NEUT% 80.7 (*) 38.4 - 76.8 %   LYMPH% 8.5 (*) 14.0 - 49.7 %   MONO% 9.1  0.0 - 14.0 %   EOS% 1.6  0.0 - 7.0 %   BASO% 0.1  0.0 - 2.0 %  COMPREHENSIVE METABOLIC PANEL (CC13)      Result Value Range   Sodium 140  136 - 145 mEq/L   Potassium 4.8  3.5 - 5.1 mEq/L   Chloride 108  98 - 109 mEq/L   CO2 24  22 - 29 mEq/L   Glucose 83  70 - 140 mg/dl   BUN 78.2  7.0 - 95.6 mg/dL   Creatinine 0.9  0.6 - 1.1 mg/dL   Total Bilirubin 2.13  0.20 - 1.20 mg/dL   Alkaline Phosphatase 39 (*) 40 - 150 U/L   AST 14  5 - 34 U/L   ALT 11  0 -  55 U/L   Total Protein 6.9  6.4 - 8.3 g/dL   Albumin 3.3 (*) 3.5 - 5.0 g/dL   Calcium 8.7  8.4 - 16.1 mg/dL  FERRITIN [facility]      Result Value Range   Ferritin 183  9 - 269 ng/ml  IRON AND TIBC [facility]      Result Value Range   Iron 64  41 - 142 ug/dL   TIBC 096  045 - 409 ug/dL   UIBC 811  914 - 782 ug/dL   %SAT 26  21 - 57 %  URINALYSIS, MICROSCOPIC - [facility]      Result Value Range   Glucose Negative  Negative mg/dL   Bilirubin (Urine) Negative  Negative   Ketones Negative  Negative mg/dL   Specific Gravity, Urine 1.015  1.003 - 1.035   Blood Large  Negative   pH 6.0  4.6 - 8.0   Protein < 30  Negative- <30 mg/dL   Urobilinogen, UR 0.2  0.2 - 1 mg/dL   Nitrite Negative  Negative   Leukocyte Esterase Moderate  Negative   RBC / HPF 7-10  0 - 2   WBC, UA 7-10 w/clumps  0 - 2   Bacteria, UA Many  Negative- Trace   Epithelial Cells Moderate  Negative- Few   urine culture pending  Studies/Results: CT 11-18-12 CT CHEST  Findings:   Mediastinum: Right internal jugular single lumen Port-A-Cath with  tip terminating in the superior cavoatrial junction. Heart size is  normal. There is no significant pericardial fluid, thickening or  pericardial calcification. There is atherosclerosis of the thoracic  aorta, the great vessels of the mediastinum and the coronary  arteries, including calcified atherosclerotic plaque in the left  main, left anterior descending, left circumflex and right coronary  arteries. No pathologically enlarged mediastinal or hilar lymph  nodes. Small hiatal hernia.  Lungs/Pleura: Postoperative changes of wedge resection in the  posterior aspect of the right upper lobe are noted. 2 mm nodule in  the right lower lobe (image 29 of series 4). A 5 mm nodule in the  medial aspect of the left lower lobe (image 39 of series 4) is  unchanged compared to prior study 05/07/2012 and remote prior  examination 02/06/2011, presumably benign. No other larger more  suspicious appearing pulmonary nodules or masses are otherwise  noted on today's examination. No acute consolidative airspace  disease. No pleural effusions.  Musculoskeletal: There are no aggressive appearing lytic or blastic  lesions noted in the visualized portions of the skeleton.  IMPRESSION:  1. Status post wedge resection in the right upper lobe without  evidence to suggest local recurrence of disease or new metastatic  disease in the thorax at this time.  2. New 2 mm nodule in the right lower lobe is highly nonspecific.  This favored to represent an area of mucoid impaction within a  distal bronchiole, but attention on future routine follow up  studies is recommended.  3. Atherosclerosis, including left main and three-vessel coronary  artery disease.  4. Small hiatal hernia.  5. Additional findings, as above, similar to prior studies.  CT ABDOMEN AND PELVIS  Findings:  Abdomen/Pelvis: Numerous well-defined low attenuation lesions are  again  noted throughout the liver, but the majority of these remain  too small to definitively characterize. The largest of these liver  lesions measures only 1.2 cm in diameter in segment 8, and is  compatible with a simple cyst. No new hepatic lesions are  noted.  The appearance of the gallbladder, pancreas, spleen and bilateral  adrenal glands is unremarkable. There are multifocal areas of mild  cortical thinning in the kidneys bilaterally, compatible with areas  of chronic scarring. In addition, there are multiple well defined  low attenuation lesions which do not enhance, compatible with  simple renal cysts, with the largest of these measuring 8.9 cm in  diameter extending exophytically off the lower pole of the left  kidney.  Extensive soft tissue thickening and enhancement in the region of  the vagina concerning for local recurrence of disease. This is  best demonstrated on images 106 - 114 of series 2. Some of this  enhancing soft tissue appears to extending anteriorly encroaching  upon the posterior aspect of the urethra, best demonstrated on  image 106-107 of series 2 and image 59 of series 6/3 (sagittal  images).  Numerous colonic diverticula are noted, without surrounding  inflammatory changes to suggest acute diverticulitis at this time.  No significant volume of ascites. No pneumoperitoneum. No  pathologic distension of small bowel. No definite pathologic  lymphadenopathy identified within the abdomen or pelvis.  Musculoskeletal: There are no aggressive appearing lytic or blastic  lesions noted in the visualized portions of the skeleton.  IMPRESSION:  1. Diffusely infiltrative enhancing soft tissue in the vagina  encroaching upon the posterior aspect of the urethra concerning for  local recurrence of disease.  2. No other definite signs of metastatic disease in the abdomen or  pelvis on today's examination.  3. Multiple simple cysts in the kidneys redemonstrated, including  the  largest lesion which is 8.9 cm in diameter extending  exophytically off the lower pole of the left kidney.  4. Colonic diverticulosis without findings to suggest acute  diverticulitis at this time.  5. Multiple small hepatic lesions are similar to prior studies,  favored to represent a combination of cysts and biliary hamartomas  PET pending 12-12-12  Medications: I have reviewed the patient's current medications. She will hold Myrbetriq (mirabegron)  Assessment/Plan:  1.Endometrial adenocarcinoma metastatic to vagina with progression and post resection of solitary pulmonary met a year ago: considering pelvic exenteration depending on results of PET. Note the urinary incontinence is very distressing to her and I have talked with her about urinary diversion because of this. I have LM for Dr Horace Porteous of palliative care. 2.PAC in, needs to be flushed q 6-8 weeks if not otherwise used, which we are trying to keep up with thru this office 3.multifactorial anemia: ongoing light vaginal bleeding, continue oral iron, follow.  4.bleed into rectus abdominus muscle thought complication of avastin, resolved  5.diverticulosis by CT  6.HTN, on lisinopril  7.degenerative arthritis  8.urinary incontinence which seems primarily related to the progressive gyn cancer now. Urinary diversion if possible might be very helpful for her symptoms. 9. Recent treatment for UTI: repeat urine culture pending and we will let urology know result also      Deryl Giroux P, MD   12/11/2012, 12:37 PM

## 2012-12-11 NOTE — Patient Instructions (Signed)
Implanted Port Instructions  An implanted port is a central line that has a round shape and is placed under the skin. It is used for long-term IV (intravenous) access for:  · Medicine.  · Fluids.  · Liquid nutrition, such as TPN (total parenteral nutrition).  · Blood samples.  Ports can be placed:  · In the chest area just below the collarbone (this is the most common place.)  · In the arms.  · In the belly (abdomen) area.  · In the legs.  PARTS OF THE PORT  A port has 2 main parts:  · The reservoir. The reservoir is round, disc-shaped, and will be a small, raised area under your skin.  · The reservoir is the part where a needle is inserted (accessed) to either give medicines or to draw blood.  · The catheter. The catheter is a long, slender tube that extends from the reservoir. The catheter is placed into a large vein.  · Medicine that is inserted into the reservoir goes into the catheter and then into the vein.  INSERTION OF THE PORT  · The port is surgically placed in either an operating room or in a procedural area (interventional radiology).  · Medicine may be given to help you relax during the procedure.  · The skin where the port will be inserted is numbed (local anesthetic).  · 1 or 2 small cuts (incisions) will be made in the skin to insert the port.  · The port can be used after it has been inserted.  INCISION SITE CARE  · The incision site may have small adhesive strips on it. This helps keep the incision site closed. Sometimes, no adhesive strips are placed. Instead of adhesive strips, a special kind of surgical glue is used to keep the incision closed.  · If adhesive strips were placed on the incision sites, do not take them off. They will fall off on their own.  · The incision site may be sore for 1 to 2 days. Pain medicine can help.  · Do not get the incision site wet. Bathe or shower as directed by your caregiver.  · The incision site should heal in 5 to 7 days. A small scar may form after the  incision has healed.  ACCESSING THE PORT  Special steps must be taken to access the port:  · Before the port is accessed, a numbing cream can be placed on the skin. This helps numb the skin over the port site.  · A sterile technique is used to access the port.  · The port is accessed with a needle. Only "non-coring" port needles should be used to access the port. Once the port is accessed, a blood return should be checked. This helps ensure the port is in the vein and is not clogged (clotted).  · If your caregiver believes your port should remain accessed, a clear (transparent) bandage will be placed over the needle site. The bandage and needle will need to be changed every week or as directed by your caregiver.  · Keep the bandage covering the needle clean and dry. Do not get it wet. Follow your caregiver's instructions on how to take a shower or bath when the port is accessed.  · If your port does not need to stay accessed, no bandage is needed over the port.  FLUSHING THE PORT  Flushing the port keeps it from getting clogged. How often the port is flushed depends on:  · If a   constant infusion is running. If a constant infusion is running, the port may not need to be flushed.  · If intermittent medicines are given.  · If the port is not being used.  For intermittent medicines:  · The port will need to be flushed:  · After medicines have been given.  · After blood has been drawn.  · As part of routine maintenance.  · A port is normally flushed with:  · Normal saline.  · Heparin.  · Follow your caregiver's advice on how often, how much, and the type of flush to use on your port.  IMPORTANT PORT INFORMATION  · Tell your caregiver if you are allergic to heparin.  · After your port is placed, you will get a manufacturer's information card. The card has information about your port. Keep this card with you at all times.  · There are many types of ports available. Know what kind of port you have.  · In case of an  emergency, it may be helpful to wear a medical alert bracelet. This can help alert health care workers that you have a port.  · The port can stay in for as long as your caregiver believes it is necessary.  · When it is time for the port to come out, surgery will be done to remove it. The surgery will be similar to how the port was put in.  · If you are in the hospital or clinic:  · Your port will be taken care of and flushed by a nurse.  · If you are at home:  · A home health care nurse may give medicines and take care of the port.  · You or a family member can get special training and directions for giving medicine and taking care of the port at home.  SEEK IMMEDIATE MEDICAL CARE IF:   · Your port does not flush or you are unable to get a blood return.  · New drainage or pus is coming from the incision.  · A bad smell is coming from the incision site.  · You develop swelling or increased redness at the incision site.  · You develop increased swelling or pain at the port site.  · You develop swelling or pain in the surrounding skin near the port.  · You have an oral temperature above 102° F (38.9° C), not controlled by medicine.  MAKE SURE YOU:   · Understand these instructions.  · Will watch your condition.  · Will get help right away if you are not doing well or get worse.  Document Released: 04/03/2005 Document Revised: 06/26/2011 Document Reviewed: 06/25/2008  [employer]® Patient Information ©2014 [employer].

## 2012-12-12 ENCOUNTER — Encounter: Payer: Self-pay | Admitting: Oncology

## 2012-12-12 ENCOUNTER — Telehealth: Payer: Self-pay | Admitting: Oncology

## 2012-12-12 ENCOUNTER — Encounter (HOSPITAL_COMMUNITY)
Admission: RE | Admit: 2012-12-12 | Discharge: 2012-12-12 | Disposition: A | Discharge status: Home / Self Care | Payer: Medicare Other | Source: Ambulatory Visit | Attending: Gynecologic Oncology | Admitting: Gynecologic Oncology

## 2012-12-12 ENCOUNTER — Encounter (HOSPITAL_COMMUNITY): Payer: Self-pay

## 2012-12-12 DIAGNOSIS — C541 Malignant neoplasm of endometrium: Secondary | ICD-10-CM

## 2012-12-12 DIAGNOSIS — C549 Malignant neoplasm of corpus uteri, unspecified: Secondary | ICD-10-CM | POA: Insufficient documentation

## 2012-12-12 LAB — GLUCOSE, CAPILLARY: Glucose-Capillary: 97 mg/dL (ref 70–99)

## 2012-12-12 MED ORDER — FLUDEOXYGLUCOSE F - 18 (FDG) INJECTION
16.5000 | Freq: Once | INTRAVENOUS | Status: AC | PRN
  Administered 2012-12-12: 16.5 via INTRAVENOUS

## 2012-12-12 NOTE — Telephone Encounter (Signed)
Medical Oncology  Return call from Dr Leron Croak [phone] to discuss coordination of care for [name]. Dr Horace Porteous to fax her note to this office. L.Kareen Hitsman MD

## 2012-12-12 NOTE — Telephone Encounter (Signed)
Medical Oncology  As discussed with patient, [facility] notified that urine specimen was obtained at [facility] on 12-11-12. Her appointment at that office for repeat urine specimen next week cancelled. We will let Dr Sherron Monday know results of urine from [facility]. L.Livesay, MS

## 2012-12-13 ENCOUNTER — Telehealth: Payer: Self-pay

## 2012-12-13 NOTE — Telephone Encounter (Signed)
Message copied by Lorine Bears on Fri Dec 13, 2012  5:27 PM ------      Message from: Jama Flavors P      Created: Thu Dec 12, 2012  1:50 PM       Need to follow up result of urine culture from 8-27 until final, and send that report to Dr Lorin Picket [facility] urology as well as letting patient know.      Cc TH, LA ------

## 2012-12-13 NOTE — Telephone Encounter (Signed)
Told Ms. Mcqueen that the urine culture did not show an infection per Dr. Darrold Span. Copy sent to Dr. Sherron Monday as requested by Dr. Darrold Span.

## 2012-12-17 ENCOUNTER — Encounter: Payer: Self-pay | Admitting: Gynecologic Oncology

## 2012-12-17 ENCOUNTER — Ambulatory Visit: Payer: Medicare Other | Attending: Gynecologic Oncology | Admitting: Gynecologic Oncology

## 2012-12-17 VITALS — BP 154/71 | HR 90 | Temp 98.1°F | Resp 18 | Ht 61.97 in | Wt 152.6 lb

## 2012-12-17 DIAGNOSIS — I10 Essential (primary) hypertension: Secondary | ICD-10-CM | POA: Insufficient documentation

## 2012-12-17 DIAGNOSIS — N390 Urinary tract infection, site not specified: Secondary | ICD-10-CM

## 2012-12-17 DIAGNOSIS — N8111 Cystocele, midline: Secondary | ICD-10-CM | POA: Insufficient documentation

## 2012-12-17 DIAGNOSIS — N898 Other specified noninflammatory disorders of vagina: Secondary | ICD-10-CM | POA: Insufficient documentation

## 2012-12-17 DIAGNOSIS — R110 Nausea: Secondary | ICD-10-CM | POA: Insufficient documentation

## 2012-12-17 DIAGNOSIS — Z9221 Personal history of antineoplastic chemotherapy: Secondary | ICD-10-CM | POA: Insufficient documentation

## 2012-12-17 DIAGNOSIS — Z923 Personal history of irradiation: Secondary | ICD-10-CM | POA: Insufficient documentation

## 2012-12-17 DIAGNOSIS — R5381 Other malaise: Secondary | ICD-10-CM | POA: Insufficient documentation

## 2012-12-17 DIAGNOSIS — R109 Unspecified abdominal pain: Secondary | ICD-10-CM | POA: Insufficient documentation

## 2012-12-17 DIAGNOSIS — C541 Malignant neoplasm of endometrium: Secondary | ICD-10-CM

## 2012-12-17 DIAGNOSIS — M899 Disorder of bone, unspecified: Secondary | ICD-10-CM | POA: Insufficient documentation

## 2012-12-17 DIAGNOSIS — R634 Abnormal weight loss: Secondary | ICD-10-CM | POA: Insufficient documentation

## 2012-12-17 DIAGNOSIS — C549 Malignant neoplasm of corpus uteri, unspecified: Secondary | ICD-10-CM | POA: Insufficient documentation

## 2012-12-17 NOTE — Progress Notes (Signed)
CHIEF COMPLAINT Recurrent endometrial cancer. 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 She received adjuvant vaginal brachytherapy.  Recurrence was noted in 2012 and disease within the vagina persisted after treatment with external beam radiotherapy and vaginal brachytherapy and Taxol carboplatin.  Patient presents today for assessment and consideration for repeat elective exenteration. At this visit her major complaints are that of the lower currently pelvic pain associated with nausea and vaginal discharge. Her findings are also notable for recent weight loss.  PET scan 4 months ago awoke confirmed the presence of disease localized to the pelvis. CT scan of the abdomen and pelvis just of rectal involvement.  On physical examination bilateral parametrial disease is appreciated. The disease is noted in both the anterior and posterior vagina and extends anteriorly to the urethra.  A repeat PET scan collected on 12/04/2012 is notable for malignant range FDG uptake is associated with a focal area of abnormal soft tissue thickening within the left lower quadrant ventral pelvic wall. This is an area where she had the Avastin associated spontaneous abdominal wall hematoma.    PLAN: A discussion was held today regarding the recommendation for total pelvic exenteration with ileal conduit and possible permanent colostomy..  the risks of the surgery discussed with the patient and her family were that of prolonged recuperation, anastomotic breakdown, management of abscess, wound infection, pulmonary embolism, fistula and severe deconditioning requiring rehabilitation. The significant likelihood that disease could recur at the incision sites or distally were also discussed. Her borderline nutritional status and her age are major components that would influence her postoperative recovery. The patient also aware  that there may be a reduction in her standard quality of life even if there is no recurrence.  The alternative that of molecular assessment was also discussed however there is a very slim likelihood that that will be curative but would be able to direct [place] with respect to additional   Biologic agents that may slow down progression of disease.  The patient and her family are leaning toward total pelvic exenteration. They understand that this procedure will occur at [facility] and that it may take several weeks before her surgery since we have to identify to identify a satisfactorily long OR block time.  If [name] declines surgical intervention the plan will be for collection or tumor tissue to send to [facility] for molecular assessment.  The patient will contact our office within a week with her final decision.  Patient was advised to increase protein and fat intake.  20 minutes was spent in discussion with the patient and her family regarding the treatment outcomes the risks and benefits of total pelvic exenteration and possible postoperative scenarios.   HISTORY OF PRESENT ILLNESS:  [name]  Is an 79 y.o. who was diagnosed with endometrial cancer in 2011. She underwent a robotic hysterectomy, bilateral salpingo oophorectomy, pelvic and right periaortic lymphadenectomy. Her final pathology was consistent with a Stage IB Grade 2 endometrioid adenocarcinoma of the uterus. She underwent 3000 of vaginal brachytherapy. In September 2012 she was noticed to have a lesion to the left of the urethra. Surgical resection versus radiation was offered and the patient  completed radiation therapy from 03/02/11 to 05/02/11 with pelvic radiation and brachytherapy to the lower vagina. The patient had persistent disease and was given three cycles of chemotherapy (weekly taxol and carboplatin).  The lesion persisted thru chemotherapy. Given the persistence  thru radiation and chemotherapy, an assessment was made  for possible exenteration.   PET 12/15/2011   Intense FDG uptake within the anterior vagina consistent with diagnosis of recurrent endometrial cancer. FDG avid pulmonary nodule in the right upper lobe, worrisome for malignancy; metatatic disease vs lung primary.   She underwent VATS wedge resection of the RUL 1 cm nodule on 01/03/2012  Path c/w metastatic multifocal endometrial cancer.  0/14 LN negative.  She then received doxil 01/2012 until 07/2012.  Avastin was added to the Doxil regimen however it was discontinued after the third cycle when the patient developed a significant spontaneous rectus muscle bleed.    PET 07/2012 IMPRESSION:  1. There are no specific features to suggest distant metastatic disease within the chest or upper abdomen.  2. Malignant range FDG uptake is associated with a focal area of abnormal soft tissue thickening within the left lower quadrant ventral pelvic wall. This is nonspecific and may represent inflammation associated postsurgical change, hematoma, or recurrent tumor.  3. Nonspecific FDG uptake in the region of the vaginal wall is noted corresponding to the soft tissue thickening noted on recent CT. Correlation with physical exam findings is advised to assess for recurrent metastatic disease to the vagina.  4. Focal area of intense uptake within the posterior wall of the lower rectum. Consider correlation with direct visualization or physical exam findings.   Since April she has been on alternating regimen of tamoxifen and Megace.  Her complaints are at the foul-smelling vaginal discharge and bleeding. She reports intermittent crampy lower abdominal pain associated with nausea. The pain does not radiate and is approximately 5/10 when it occurs. She reports severe fatigue for which she is unable to participate in activities which she enjoys, she reports poor appetite and a 12 pound weight loss. reports occasional headache and dizziness. She denies flank pain she denies  lower treatment edema but does report right-sided vulvar edema  PET scan 12/12/2012 IMPRESSION:  Soft tissue mass involving the vagina and possibly involving the bladder base/urethra, worrisome for recurrent/metastatic disease, max SUV 30.7. Mild thickening with hypermetabolism in the left inferior rectus muscle, max SUV 4.2. While this may be postprocedural, metastatic disease is not excluded. Postsurgical changes related to prior right upper lobe wedge resection.   PMH: Hypertension, osteopenia, parathyriod adenoma, hemorrhoids, kidney stones, hyperlipidemia, liver cysts, cystocele, histroy of strep infection in leg  PSH: robotic hysterectomy, bilateral salpingo oophorectomy and staging, sling anterior posterior repair at time of hysterectomy and staging, kidney stone removal, breast reduction, parathryriodectomy, leg debridement, Dilation and curretage x2   VATS wedge resection of the RUL 01/03/2012    Health maintenance:  mammogram 2011, colonoscopy 2007   SOCIAL HISTORY  Married, denies tobacco or alcohol use.  Accompanied to this visit by her two daughters and husband.  Family history of ovarian, uterine, colon or breast malignancy: None  Family history of coagulopathy: None    REVIEW OF SYSTEMS Constitutional  Reports excessive fatigue, 12 pound weight loss, Cardiovascular  No chest pain, palpitations, difficulty breathing, high blood pressure reports severe fatigue Pulmonary  No shortness of breath, cough, wheezing, or hemoptysis.  Endocrine  Gastro Intestinal  Reports a poor appetite, intermittent nausea no bleeding, reports  constipation, denies diarrhea, or BRBPR.  Genito Urinary  No pain with urination, persistent leakage of urine, but reports intermittent void strength a day.  Continuous daily vaginal bleeding and foul-smelling discharge  Neurologic  No frequent or severe headaches, dizziness, fainting, or recurrent numbness Psychology  No  mood swing / irritability, or  depression / anxiety.  Heme/Lymph  denies adenopathy   Physical Examination  Vitals.  BP 154/71  Pulse 90  Temp(Src) 98.1 F (36.7 C) (Oral)  Resp 18  Ht 5' 1.97" (1.574 m)  Wt 152 lb 9.6 oz (69.219 kg)  BMI 27.94 kg/m2  Wt Readings from Last 3 Encounters:  12/17/12 152 lb 9.6 oz (69.219 kg)  12/11/12 152 lb 3.2 oz (69.037 kg)  12/10/12 151 lb 8 oz (68.72 kg)    General  The patient is a pleasant Caucasian lady accompanied by her husband and daughter in no acute distress   Albumin 3.7 T.Protein 6.9

## 2013-01-13 ENCOUNTER — Telehealth: Payer: Self-pay | Admitting: Gynecologic Oncology

## 2013-01-13 NOTE — Telephone Encounter (Signed)
Called patient to see if there are any more questions and to make her aware that on Wednesday morning additional markings will be placed for ostomys.

## 2013-04-17 DEATH — deceased

## 2013-09-17 IMAGING — PT NM PET TUM IMG INITIAL (PI) SKULL BASE T - THIGH
4 series · 25 of 25 positions shown · non-contrast
Comparison: 02/06/2011

CLINICAL DATA: Subsequent  treatment strategy for endometrial
cancer.

NUCLEAR MEDICINE PET SKULL BASE TO THIGH
Fasting Blood Glucose:  109
TECHNIQUE: 16 mCi F-18 FDG was injected intravenously. CT data was
obtained and used for attenuation correction and anatomic
localization only.  (This was not acquired as a diagnostic CT
examination.) Additional exam technical data entered on
technologist worksheet.

[Series 1: pet ac · axial · 3.3mm · 4.69mm/px · z∈[-870,+0]mm · 10 of 267 slices shown]
[im 1/267]
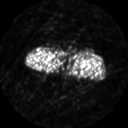
[im 30/267]
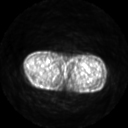
[im 60/267]
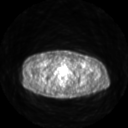
[im 89/267]
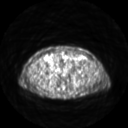
[im 119/267]
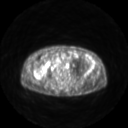
[im 148/267]
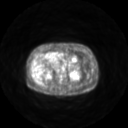
[im 178/267]
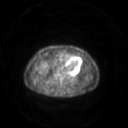
[im 207/267]
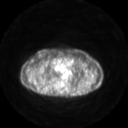
[im 237/267]
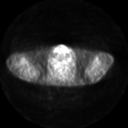
[im 267/267]
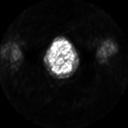

[Series 2: pet nac · axial · 3.3mm · 4.69mm/px · z∈[-870,+0]mm · 11 of 267 slices shown]
[im 1/267]
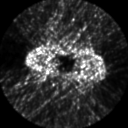
[im 27/267]
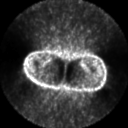
[im 54/267]
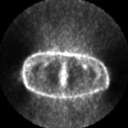
[im 80/267]
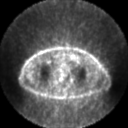
[im 107/267]
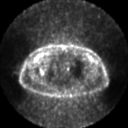
[im 134/267]
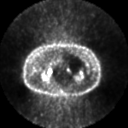
[im 160/267]
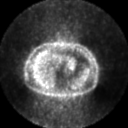
[im 187/267]
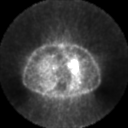
[im 213/267]
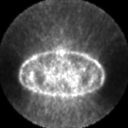
[im 240/267]
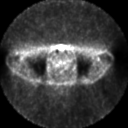
[im 267/267]
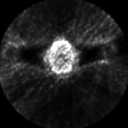

[Series 123: mip · coronal · 3.3mm · 4.69mm/px · 1 of 30 slices shown]
[im 1/30]
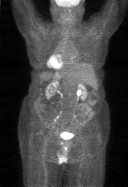

[Series 153: reformatted · coronal · 4.7mm · 6.98mm/px · 3 of 65 slices shown]
[im 1/65]
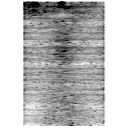
[im 33/65]
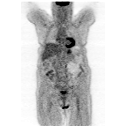
[im 65/65]
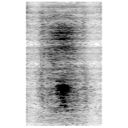

[25 of 25 positions shown; findings below may reference images not displayed]

FINDINGS: Neck: No hypermetabolic lymph nodes in the neck.

Chest:  No hypermetabolic mediastinal or hilar nodes.  No
suspicious pulmonary nodules on the CT scan. Stable 5 mm nodule in
the posterior left lower lobe, image 89/series 2.

Abdomen/Pelvis:  No abnormal hypermetabolic activity within the
liver, pancreas, adrenal glands, or spleen.  No hypermetabolic
lymph nodes in the abdomen or pelvis.

Within the left lower quadrant of the abdomen in the region of the
ventral abdominal wall there is a focal area of soft tissue
thickening which measures 3.1 x 4.7 cm.  The SUV max associated
this area is equal to 4.7, image 192.

 Similar to recent CT there is abnormal thickening involving the
vaginal wall.  The SUV max associated this area is equal to 5.8.

Focal, solitary area of intense uptake localizing to the posterior
wall of the lower rectum is noted.  This has an SUV max equal to
10.8, image 212.

Skeleton:  No focal hypermetabolic activity to suggest skeletal
metastasis.
IMPRESSION: 1.  There are no specific features to suggest distant metastatic
disease within the chest or upper abdomen.
2.  Malignant range FDG uptake is associated with a focal area of
abnormal soft tissue thickening within the left lower quadrant
ventral pelvic wall.  This is nonspecific and may represent
inflammation associated postsurgical change, hematoma, or recurrent
tumor.
3.  Nonspecific FDG uptake in the region of the vaginal wall is
noted corresponding to the soft tissue thickening noted on recent
CT.  Correlation with physical exam findings is advised to assess
for recurrent metastatic disease to the vagina.
4.  Focal area of intense uptake within the posterior wall of the
lower rectum. Consider correlation with direct visualization or
physical exam findings.

## 2013-12-09 ENCOUNTER — Other Ambulatory Visit: Payer: Self-pay | Admitting: Pharmacist

## 2015-05-20 ENCOUNTER — Telehealth: Payer: Self-pay | Admitting: Oncology

## 2015-05-20 NOTE — Telephone Encounter (Signed)
FAXED  REQUESTED RECORDS TO [facility] [phone] RELEASE ID)

## 2015-06-08 ENCOUNTER — Telehealth: Payer: Self-pay | Admitting: *Deleted

## 2015-06-08 NOTE — Telephone Encounter (Signed)
On 06-08-15 fax medical records to the [facility] it was consult note, end of tx note.
